# Patient Record
Sex: Male | Born: 1935
Health system: Southern US, Community
[De-identification: ages and names within clinical notes are randomized; demographics above are authoritative.]

## PROBLEM LIST (undated history)

## (undated) DIAGNOSIS — K219 Gastro-esophageal reflux disease without esophagitis: Secondary | ICD-10-CM

## (undated) DIAGNOSIS — K5790 Diverticulosis of intestine, part unspecified, without perforation or abscess without bleeding: Secondary | ICD-10-CM

## (undated) DIAGNOSIS — M1990 Unspecified osteoarthritis, unspecified site: Secondary | ICD-10-CM

## (undated) DIAGNOSIS — E039 Hypothyroidism, unspecified: Secondary | ICD-10-CM

## (undated) DIAGNOSIS — J45909 Unspecified asthma, uncomplicated: Secondary | ICD-10-CM

## (undated) DIAGNOSIS — N400 Enlarged prostate without lower urinary tract symptoms: Secondary | ICD-10-CM

## (undated) DIAGNOSIS — M26609 Unspecified temporomandibular joint disorder, unspecified side: Secondary | ICD-10-CM

## (undated) DIAGNOSIS — K5120 Ulcerative (chronic) proctitis without complications: Secondary | ICD-10-CM

## (undated) DIAGNOSIS — N419 Inflammatory disease of prostate, unspecified: Secondary | ICD-10-CM

## (undated) DIAGNOSIS — K529 Noninfective gastroenteritis and colitis, unspecified: Secondary | ICD-10-CM

## (undated) DIAGNOSIS — Z860100 Personal history of colon polyps, unspecified: Secondary | ICD-10-CM

## (undated) DIAGNOSIS — C4490 Unspecified malignant neoplasm of skin, unspecified: Secondary | ICD-10-CM

## (undated) DIAGNOSIS — K2270 Barrett's esophagus without dysplasia: Secondary | ICD-10-CM

## (undated) DIAGNOSIS — J42 Unspecified chronic bronchitis: Secondary | ICD-10-CM

## (undated) DIAGNOSIS — H409 Unspecified glaucoma: Secondary | ICD-10-CM

## (undated) HISTORY — DX: Unspecified malignant neoplasm of skin, unspecified: C44.90

## (undated) HISTORY — PX: PROSTATE SURGERY: SHX751

## (undated) HISTORY — PX: HERNIA REPAIR: SHX51

## (undated) HISTORY — DX: Benign prostatic hyperplasia without lower urinary tract symptoms: N40.0

## (undated) HISTORY — DX: Unspecified temporomandibular joint disorder, unspecified side: M26.609

## (undated) HISTORY — PX: EXTERNAL EAR SURGERY: SHX627

## (undated) HISTORY — PX: SEPTOPLASTY: SUR1290

## (undated) HISTORY — PX: HAND SURGERY: SHX662

## (undated) HISTORY — DX: Noninfective gastroenteritis and colitis, unspecified: K52.9

## (undated) HISTORY — PX: CYSTECTOMY: SUR359

---

## 1999-10-14 ENCOUNTER — Other Ambulatory Visit: Admission: RE | Admit: 1999-10-14 | Discharge: 1999-10-14 | Payer: Self-pay | Admitting: Urology

## 2005-02-13 ENCOUNTER — Emergency Department (HOSPITAL_COMMUNITY): Admission: EM | Admit: 2005-02-13 | Discharge: 2005-02-13 | Payer: Self-pay | Admitting: Emergency Medicine

## 2005-04-06 ENCOUNTER — Emergency Department (HOSPITAL_COMMUNITY): Admission: EM | Admit: 2005-04-06 | Discharge: 2005-04-06 | Payer: Self-pay | Admitting: Emergency Medicine

## 2005-06-24 ENCOUNTER — Encounter: Admission: RE | Admit: 2005-06-24 | Discharge: 2005-06-24 | Payer: Self-pay | Admitting: Urology

## 2005-06-28 ENCOUNTER — Ambulatory Visit (HOSPITAL_BASED_OUTPATIENT_CLINIC_OR_DEPARTMENT_OTHER): Admission: RE | Admit: 2005-06-28 | Discharge: 2005-06-28 | Payer: Self-pay | Admitting: Urology

## 2006-03-16 ENCOUNTER — Ambulatory Visit: Payer: Self-pay | Admitting: Unknown Physician Specialty

## 2010-01-01 ENCOUNTER — Ambulatory Visit: Payer: Self-pay | Admitting: Unknown Physician Specialty

## 2010-01-02 LAB — PATHOLOGY REPORT

## 2011-06-29 DIAGNOSIS — R339 Retention of urine, unspecified: Secondary | ICD-10-CM | POA: Diagnosis not present

## 2011-06-29 DIAGNOSIS — N401 Enlarged prostate with lower urinary tract symptoms: Secondary | ICD-10-CM | POA: Diagnosis not present

## 2011-06-29 DIAGNOSIS — R972 Elevated prostate specific antigen [PSA]: Secondary | ICD-10-CM | POA: Diagnosis not present

## 2011-07-06 DIAGNOSIS — H40039 Anatomical narrow angle, unspecified eye: Secondary | ICD-10-CM | POA: Diagnosis not present

## 2011-07-13 DIAGNOSIS — R5383 Other fatigue: Secondary | ICD-10-CM | POA: Diagnosis not present

## 2011-07-13 DIAGNOSIS — L259 Unspecified contact dermatitis, unspecified cause: Secondary | ICD-10-CM | POA: Diagnosis not present

## 2011-07-13 DIAGNOSIS — Z13220 Encounter for screening for lipoid disorders: Secondary | ICD-10-CM | POA: Diagnosis not present

## 2011-07-13 DIAGNOSIS — R5381 Other malaise: Secondary | ICD-10-CM | POA: Diagnosis not present

## 2011-07-13 DIAGNOSIS — Z0000 Encounter for general adult medical examination without abnormal findings: Secondary | ICD-10-CM | POA: Diagnosis not present

## 2011-08-05 DIAGNOSIS — E039 Hypothyroidism, unspecified: Secondary | ICD-10-CM | POA: Diagnosis not present

## 2011-08-05 DIAGNOSIS — Z0000 Encounter for general adult medical examination without abnormal findings: Secondary | ICD-10-CM | POA: Diagnosis not present

## 2011-08-05 DIAGNOSIS — L259 Unspecified contact dermatitis, unspecified cause: Secondary | ICD-10-CM | POA: Diagnosis not present

## 2011-08-09 DIAGNOSIS — K5120 Ulcerative (chronic) proctitis without complications: Secondary | ICD-10-CM | POA: Diagnosis not present

## 2011-08-09 DIAGNOSIS — I10 Essential (primary) hypertension: Secondary | ICD-10-CM | POA: Diagnosis not present

## 2011-11-23 DIAGNOSIS — Z0000 Encounter for general adult medical examination without abnormal findings: Secondary | ICD-10-CM | POA: Diagnosis not present

## 2011-11-23 DIAGNOSIS — L259 Unspecified contact dermatitis, unspecified cause: Secondary | ICD-10-CM | POA: Diagnosis not present

## 2011-11-23 DIAGNOSIS — E039 Hypothyroidism, unspecified: Secondary | ICD-10-CM | POA: Diagnosis not present

## 2011-12-13 DIAGNOSIS — Z85828 Personal history of other malignant neoplasm of skin: Secondary | ICD-10-CM | POA: Diagnosis not present

## 2011-12-13 DIAGNOSIS — C44611 Basal cell carcinoma of skin of unspecified upper limb, including shoulder: Secondary | ICD-10-CM | POA: Diagnosis not present

## 2011-12-13 DIAGNOSIS — D485 Neoplasm of uncertain behavior of skin: Secondary | ICD-10-CM | POA: Diagnosis not present

## 2011-12-13 DIAGNOSIS — L570 Actinic keratosis: Secondary | ICD-10-CM | POA: Diagnosis not present

## 2012-01-04 DIAGNOSIS — H4010X0 Unspecified open-angle glaucoma, stage unspecified: Secondary | ICD-10-CM | POA: Diagnosis not present

## 2012-01-11 DIAGNOSIS — C44621 Squamous cell carcinoma of skin of unspecified upper limb, including shoulder: Secondary | ICD-10-CM | POA: Diagnosis not present

## 2012-01-17 DIAGNOSIS — A499 Bacterial infection, unspecified: Secondary | ICD-10-CM | POA: Diagnosis not present

## 2012-02-01 DIAGNOSIS — Z23 Encounter for immunization: Secondary | ICD-10-CM | POA: Diagnosis not present

## 2012-02-11 DIAGNOSIS — Z0000 Encounter for general adult medical examination without abnormal findings: Secondary | ICD-10-CM | POA: Diagnosis not present

## 2012-02-11 DIAGNOSIS — L259 Unspecified contact dermatitis, unspecified cause: Secondary | ICD-10-CM | POA: Diagnosis not present

## 2012-02-11 DIAGNOSIS — M7710 Lateral epicondylitis, unspecified elbow: Secondary | ICD-10-CM | POA: Diagnosis not present

## 2012-04-12 DIAGNOSIS — Z85828 Personal history of other malignant neoplasm of skin: Secondary | ICD-10-CM | POA: Diagnosis not present

## 2012-04-12 DIAGNOSIS — L570 Actinic keratosis: Secondary | ICD-10-CM | POA: Diagnosis not present

## 2012-04-21 DIAGNOSIS — E039 Hypothyroidism, unspecified: Secondary | ICD-10-CM | POA: Diagnosis not present

## 2012-04-21 DIAGNOSIS — J069 Acute upper respiratory infection, unspecified: Secondary | ICD-10-CM | POA: Diagnosis not present

## 2012-04-21 DIAGNOSIS — Z0000 Encounter for general adult medical examination without abnormal findings: Secondary | ICD-10-CM | POA: Diagnosis not present

## 2012-07-04 DIAGNOSIS — H4010X0 Unspecified open-angle glaucoma, stage unspecified: Secondary | ICD-10-CM | POA: Diagnosis not present

## 2012-08-14 DIAGNOSIS — E039 Hypothyroidism, unspecified: Secondary | ICD-10-CM | POA: Diagnosis not present

## 2012-08-14 DIAGNOSIS — Z0000 Encounter for general adult medical examination without abnormal findings: Secondary | ICD-10-CM | POA: Diagnosis not present

## 2012-08-14 DIAGNOSIS — Z125 Encounter for screening for malignant neoplasm of prostate: Secondary | ICD-10-CM | POA: Diagnosis not present

## 2012-08-14 DIAGNOSIS — M7710 Lateral epicondylitis, unspecified elbow: Secondary | ICD-10-CM | POA: Diagnosis not present

## 2012-08-14 DIAGNOSIS — Z1331 Encounter for screening for depression: Secondary | ICD-10-CM | POA: Diagnosis not present

## 2012-08-14 DIAGNOSIS — Z1339 Encounter for screening examination for other mental health and behavioral disorders: Secondary | ICD-10-CM | POA: Diagnosis not present

## 2012-11-29 DIAGNOSIS — Z125 Encounter for screening for malignant neoplasm of prostate: Secondary | ICD-10-CM | POA: Diagnosis not present

## 2012-11-29 DIAGNOSIS — Z1339 Encounter for screening examination for other mental health and behavioral disorders: Secondary | ICD-10-CM | POA: Diagnosis not present

## 2012-11-29 DIAGNOSIS — L988 Other specified disorders of the skin and subcutaneous tissue: Secondary | ICD-10-CM | POA: Diagnosis not present

## 2012-11-29 DIAGNOSIS — M7710 Lateral epicondylitis, unspecified elbow: Secondary | ICD-10-CM | POA: Diagnosis not present

## 2012-12-11 DIAGNOSIS — L570 Actinic keratosis: Secondary | ICD-10-CM | POA: Diagnosis not present

## 2012-12-11 DIAGNOSIS — Z85828 Personal history of other malignant neoplasm of skin: Secondary | ICD-10-CM | POA: Diagnosis not present

## 2013-01-02 DIAGNOSIS — H40009 Preglaucoma, unspecified, unspecified eye: Secondary | ICD-10-CM | POA: Diagnosis not present

## 2013-01-30 DIAGNOSIS — Z23 Encounter for immunization: Secondary | ICD-10-CM | POA: Diagnosis not present

## 2013-03-15 DIAGNOSIS — K5120 Ulcerative (chronic) proctitis without complications: Secondary | ICD-10-CM | POA: Diagnosis not present

## 2013-06-26 ENCOUNTER — Encounter: Payer: Self-pay | Admitting: Cardiovascular Disease

## 2013-06-26 ENCOUNTER — Ambulatory Visit (INDEPENDENT_AMBULATORY_CARE_PROVIDER_SITE_OTHER): Payer: Medicare Other | Admitting: Cardiovascular Disease

## 2013-06-26 ENCOUNTER — Encounter (INDEPENDENT_AMBULATORY_CARE_PROVIDER_SITE_OTHER): Payer: Self-pay

## 2013-06-26 VITALS — BP 130/60 | HR 69 | Ht 71.5 in | Wt 170.2 lb

## 2013-06-26 DIAGNOSIS — K5289 Other specified noninfective gastroenteritis and colitis: Secondary | ICD-10-CM

## 2013-06-26 DIAGNOSIS — R0789 Other chest pain: Secondary | ICD-10-CM | POA: Insufficient documentation

## 2013-06-26 DIAGNOSIS — K529 Noninfective gastroenteritis and colitis, unspecified: Secondary | ICD-10-CM

## 2013-06-26 HISTORY — DX: Noninfective gastroenteritis and colitis, unspecified: K52.9

## 2013-06-26 NOTE — Progress Notes (Signed)
   Patient ID: Jesse Sandoval, male    DOB: November 21, 1931, 80 y.o.   MRN: [id number]  HPI Comments: Mr Cueva is a pleasant 80 yo male patient of Dr. Caryn Section, who presents with episodes of chest tightness.  He reports that over the past several months he has had left pectoral pain that comes and goes sometimes at rest, sometimes with exertion. He walks daily up to 2 miles at a time and denies having any symptoms when walking. Symptoms can come while he is on the couch, sometimes when active. When he has symptoms, he will rub his pectoral region and typically symptoms will resolve. Pain does not feel deep, more superficial. In general is not associated with exertion .  He is very active [place] his [place] fixing machinery, doing a lot of gardening, lifting heavy items   No prior smoking history, no diabetes  Never been told that his cholesterol is high  Lab work from Dr. Caryn Section 2013 shows total cholesterol 167, LDL 88, HDL 65  EKG today shows normal sinus rhythm with rate 69 beats a minute with no significant ST or T wave changes    Outpatient Encounter Prescriptions as of 06/26/2013  Medication Sig  . brimonidine (ALPHAGAN) 0.2 % ophthalmic solution Place 2 drops into both eyes 2 (two) times daily.  Marland Kitchen levothyroxine (SYNTHROID, LEVOTHROID) 50 MCG tablet Take 50 mcg by mouth daily before breakfast.  . mesalamine (CANASA) 1000 MG suppository Place 1,000 mg rectally as needed.   . [DISCONTINUED] triamcinolone cream (KENALOG) 0.5 % Apply 1 application topically 2 (two) times daily.    Review of Systems  Constitutional: Negative.   HENT: Negative.   Eyes: Negative.   Respiratory: Negative.   Cardiovascular: Positive for chest pain.  Gastrointestinal: Negative.   Endocrine: Negative.   Musculoskeletal: Negative.   Skin: Negative.   Allergic/Immunologic: Negative.   Neurological: Negative.   Hematological: Negative.   Psychiatric/Behavioral: Negative.   All other systems reviewed and are  negative.    BP 130/60  Pulse 69  Ht 5' 11.5" (1.816 m)  Wt 170 lb 4 oz (77.225 kg)  BMI 23.42 kg/m2  Physical Exam  Nursing note and vitals reviewed. Constitutional: He is oriented to person, place, and time. He appears well-developed and well-nourished.  HENT:  Head: Normocephalic.  Nose: Nose normal.  Mouth/Throat: Oropharynx is clear and moist.  Eyes: Conjunctivae are normal. Pupils are equal, round, and reactive to light.  Neck: Normal range of motion. Neck supple. No JVD present.  Cardiovascular: Normal rate, regular rhythm, S1 normal, S2 normal, normal heart sounds and intact distal pulses.  Exam reveals no gallop and no friction rub.   No murmur heard. Pulmonary/Chest: Effort normal and breath sounds normal. No respiratory distress. He has no wheezes. He has no rales. He exhibits no tenderness.  Abdominal: Soft. Bowel sounds are normal. He exhibits no distension. There is no tenderness.  Musculoskeletal: Normal range of motion. He exhibits no edema and no tenderness.  Lymphadenopathy:    He has no cervical adenopathy.  Neurological: He is alert and oriented to person, place, and time. Coordination normal.  Skin: Skin is warm and dry. No rash noted. No erythema.  Psychiatric: He has a normal mood and affect. His behavior is normal. Judgment and thought content normal.      Assessment and Plan

## 2013-06-26 NOTE — Assessment & Plan Note (Signed)
He reports periodic issues with his colitis. Not an active issue

## 2013-06-26 NOTE — Assessment & Plan Note (Addendum)
Etiology of his chest tightness is likely noncardiac. He is low risk of CAD given good cholesterol, no smoking history, no diabetes . There is a clear musculoskeletal component as symptoms are resolved with massage, coming on with rest. We have recommended he contact our office if symptoms present with exertion on his daily walks. Currently he has no significant symptoms when walking up a hill. No stress test ordered at this time given the atypical nature of his symptoms. We have recommended massage, heat, NSAIDs as needed for his left pectoral pain

## 2013-06-26 NOTE — Patient Instructions (Signed)
You are doing well. No medication changes were made.  Please call the office if you have worsening shortness of breath or chest pain  Please call us if you have new issues that need to be addressed before your next appt.

## 2013-07-03 DIAGNOSIS — H4010X0 Unspecified open-angle glaucoma, stage unspecified: Secondary | ICD-10-CM | POA: Diagnosis not present

## 2013-08-15 ENCOUNTER — Ambulatory Visit: Payer: Self-pay | Admitting: Family Medicine

## 2013-08-15 DIAGNOSIS — M549 Dorsalgia, unspecified: Secondary | ICD-10-CM | POA: Diagnosis not present

## 2013-08-15 DIAGNOSIS — M47817 Spondylosis without myelopathy or radiculopathy, lumbosacral region: Secondary | ICD-10-CM | POA: Diagnosis not present

## 2013-08-15 DIAGNOSIS — M5137 Other intervertebral disc degeneration, lumbosacral region: Secondary | ICD-10-CM | POA: Diagnosis not present

## 2013-08-15 DIAGNOSIS — Z23 Encounter for immunization: Secondary | ICD-10-CM | POA: Diagnosis not present

## 2013-08-15 DIAGNOSIS — Z125 Encounter for screening for malignant neoplasm of prostate: Secondary | ICD-10-CM | POA: Diagnosis not present

## 2013-08-15 DIAGNOSIS — Z0000 Encounter for general adult medical examination without abnormal findings: Secondary | ICD-10-CM | POA: Diagnosis not present

## 2013-08-15 DIAGNOSIS — E039 Hypothyroidism, unspecified: Secondary | ICD-10-CM | POA: Diagnosis not present

## 2013-08-15 DIAGNOSIS — Z131 Encounter for screening for diabetes mellitus: Secondary | ICD-10-CM | POA: Diagnosis not present

## 2013-08-15 DIAGNOSIS — J309 Allergic rhinitis, unspecified: Secondary | ICD-10-CM | POA: Diagnosis not present

## 2013-12-10 DIAGNOSIS — Z85828 Personal history of other malignant neoplasm of skin: Secondary | ICD-10-CM | POA: Diagnosis not present

## 2013-12-10 DIAGNOSIS — L570 Actinic keratosis: Secondary | ICD-10-CM | POA: Diagnosis not present

## 2014-01-01 DIAGNOSIS — H16223 Keratoconjunctivitis sicca, not specified as Sjogren's, bilateral: Secondary | ICD-10-CM | POA: Diagnosis not present

## 2014-01-01 DIAGNOSIS — H40053 Ocular hypertension, bilateral: Secondary | ICD-10-CM | POA: Diagnosis not present

## 2014-01-10 DIAGNOSIS — H40053 Ocular hypertension, bilateral: Secondary | ICD-10-CM | POA: Diagnosis not present

## 2014-01-22 DIAGNOSIS — Z23 Encounter for immunization: Secondary | ICD-10-CM | POA: Diagnosis not present

## 2014-04-10 DIAGNOSIS — H40053 Ocular hypertension, bilateral: Secondary | ICD-10-CM | POA: Diagnosis not present

## 2014-04-30 DIAGNOSIS — Z86010 Personal history of colonic polyps: Secondary | ICD-10-CM | POA: Diagnosis not present

## 2014-04-30 DIAGNOSIS — K5120 Ulcerative (chronic) proctitis without complications: Secondary | ICD-10-CM | POA: Diagnosis not present

## 2014-07-11 DIAGNOSIS — H4010X0 Unspecified open-angle glaucoma, stage unspecified: Secondary | ICD-10-CM | POA: Diagnosis not present

## 2014-07-18 DIAGNOSIS — H40033 Anatomical narrow angle, bilateral: Secondary | ICD-10-CM | POA: Diagnosis not present

## 2014-07-18 DIAGNOSIS — H40053 Ocular hypertension, bilateral: Secondary | ICD-10-CM | POA: Diagnosis not present

## 2014-08-23 DIAGNOSIS — R109 Unspecified abdominal pain: Secondary | ICD-10-CM | POA: Diagnosis not present

## 2014-08-23 DIAGNOSIS — E039 Hypothyroidism, unspecified: Secondary | ICD-10-CM | POA: Diagnosis not present

## 2014-08-23 DIAGNOSIS — Z0000 Encounter for general adult medical examination without abnormal findings: Secondary | ICD-10-CM | POA: Diagnosis not present

## 2014-08-23 DIAGNOSIS — M545 Low back pain: Secondary | ICD-10-CM | POA: Diagnosis not present

## 2014-08-23 LAB — BASIC METABOLIC PANEL
BUN: 32 mg/dL — AB (ref 4–21)
Creatinine: 1.3 mg/dL (ref 0.6–1.3)
GLUCOSE: 98 mg/dL
POTASSIUM: 4.7 mmol/L (ref 3.4–5.3)
SODIUM: 143 mmol/L (ref 137–147)

## 2014-08-23 LAB — CBC AND DIFFERENTIAL
HCT: 40 % — AB (ref 41–53)
Hemoglobin: 13.5 g/dL (ref 13.5–17.5)
NEUTROS ABS: 4 /uL
Platelets: 256 10*3/uL (ref 150–399)
WBC: 7.7 10^3/mL

## 2014-08-23 LAB — HEPATIC FUNCTION PANEL
ALT: 14 U/L (ref 10–40)
AST: 19 U/L (ref 14–40)
Alkaline Phosphatase: 101 U/L (ref 25–125)
BILIRUBIN, TOTAL: 0.3 mg/dL

## 2014-08-23 LAB — TSH: TSH: 3.8 u[IU]/mL (ref 0.41–5.90)

## 2014-08-30 ENCOUNTER — Telehealth: Payer: Self-pay

## 2014-08-30 NOTE — Telephone Encounter (Signed)
-----   Message from Birdie Sons, MD sent at 08/28/2014  9:14 PM EDT ----- Regarding: Lab results Please advise patient that labs are all normal except mild kidney impairment. His abdominal pain is probable from stomach irritation. Recommend he try OTC ranitidine 150mg  twice a day. If pain is not gone within 2 weeks, then he needs to call back for GI referral.

## 2014-09-02 ENCOUNTER — Encounter: Payer: Self-pay | Admitting: Family Medicine

## 2014-09-02 NOTE — Telephone Encounter (Signed)
Patient's wife Ronney Lion was notified of results.

## 2014-11-05 DIAGNOSIS — K5120 Ulcerative (chronic) proctitis without complications: Secondary | ICD-10-CM | POA: Diagnosis not present

## 2014-11-05 DIAGNOSIS — Z86010 Personal history of colonic polyps: Secondary | ICD-10-CM | POA: Diagnosis not present

## 2014-12-09 DIAGNOSIS — X32XXXA Exposure to sunlight, initial encounter: Secondary | ICD-10-CM | POA: Diagnosis not present

## 2014-12-09 DIAGNOSIS — Z85828 Personal history of other malignant neoplasm of skin: Secondary | ICD-10-CM | POA: Diagnosis not present

## 2014-12-09 DIAGNOSIS — L570 Actinic keratosis: Secondary | ICD-10-CM | POA: Diagnosis not present

## 2014-12-09 DIAGNOSIS — L821 Other seborrheic keratosis: Secondary | ICD-10-CM | POA: Diagnosis not present

## 2015-01-02 ENCOUNTER — Encounter: Payer: Self-pay | Admitting: *Deleted

## 2015-01-03 ENCOUNTER — Encounter: Payer: Self-pay | Admitting: *Deleted

## 2015-01-03 ENCOUNTER — Encounter
Admission: RE | Disposition: A | Discharge status: Home / Self Care | Payer: Self-pay | Source: Ambulatory Visit | Attending: Unknown Physician Specialty

## 2015-01-03 ENCOUNTER — Ambulatory Visit: Payer: Medicare Other | Admitting: Anesthesiology

## 2015-01-03 ENCOUNTER — Ambulatory Visit
Admission: RE | Admit: 2015-01-03 | Discharge: 2015-01-03 | Disposition: A | Discharge status: Home / Self Care | Payer: Medicare Other | Source: Ambulatory Visit | Attending: Unknown Physician Specialty | Admitting: Unknown Physician Specialty

## 2015-01-03 DIAGNOSIS — H409 Unspecified glaucoma: Secondary | ICD-10-CM | POA: Diagnosis not present

## 2015-01-03 DIAGNOSIS — Z880 Allergy status to penicillin: Secondary | ICD-10-CM | POA: Insufficient documentation

## 2015-01-03 DIAGNOSIS — E079 Disorder of thyroid, unspecified: Secondary | ICD-10-CM | POA: Diagnosis not present

## 2015-01-03 DIAGNOSIS — D120 Benign neoplasm of cecum: Secondary | ICD-10-CM | POA: Diagnosis not present

## 2015-01-03 DIAGNOSIS — Z885 Allergy status to narcotic agent status: Secondary | ICD-10-CM | POA: Insufficient documentation

## 2015-01-03 DIAGNOSIS — Z882 Allergy status to sulfonamides status: Secondary | ICD-10-CM | POA: Insufficient documentation

## 2015-01-03 DIAGNOSIS — Z79899 Other long term (current) drug therapy: Secondary | ICD-10-CM | POA: Diagnosis not present

## 2015-01-03 DIAGNOSIS — K635 Polyp of colon: Secondary | ICD-10-CM | POA: Diagnosis not present

## 2015-01-03 DIAGNOSIS — E039 Hypothyroidism, unspecified: Secondary | ICD-10-CM | POA: Diagnosis not present

## 2015-01-03 DIAGNOSIS — K648 Other hemorrhoids: Secondary | ICD-10-CM | POA: Diagnosis not present

## 2015-01-03 DIAGNOSIS — K5120 Ulcerative (chronic) proctitis without complications: Secondary | ICD-10-CM | POA: Insufficient documentation

## 2015-01-03 DIAGNOSIS — D123 Benign neoplasm of transverse colon: Secondary | ICD-10-CM | POA: Insufficient documentation

## 2015-01-03 DIAGNOSIS — Z86010 Personal history of colonic polyps: Secondary | ICD-10-CM | POA: Diagnosis present

## 2015-01-03 DIAGNOSIS — D124 Benign neoplasm of descending colon: Secondary | ICD-10-CM | POA: Diagnosis not present

## 2015-01-03 DIAGNOSIS — K640 First degree hemorrhoids: Secondary | ICD-10-CM | POA: Insufficient documentation

## 2015-01-03 DIAGNOSIS — N400 Enlarged prostate without lower urinary tract symptoms: Secondary | ICD-10-CM | POA: Insufficient documentation

## 2015-01-03 DIAGNOSIS — D122 Benign neoplasm of ascending colon: Secondary | ICD-10-CM | POA: Insufficient documentation

## 2015-01-03 DIAGNOSIS — Z85828 Personal history of other malignant neoplasm of skin: Secondary | ICD-10-CM | POA: Diagnosis not present

## 2015-01-03 HISTORY — PX: COLONOSCOPY WITH PROPOFOL: SHX5780

## 2015-01-03 HISTORY — DX: Diverticulosis of intestine, part unspecified, without perforation or abscess without bleeding: K57.90

## 2015-01-03 HISTORY — DX: Personal history of colon polyps, unspecified: Z86.0100

## 2015-01-03 HISTORY — DX: Unspecified glaucoma: H40.9

## 2015-01-03 HISTORY — DX: Unspecified osteoarthritis, unspecified site: M19.90

## 2015-01-03 HISTORY — DX: Inflammatory disease of prostate, unspecified: N41.9

## 2015-01-03 HISTORY — DX: Ulcerative (chronic) proctitis without complications: K51.20

## 2015-01-03 HISTORY — DX: Personal history of colonic polyps: Z86.010

## 2015-01-03 HISTORY — DX: Barrett's esophagus without dysplasia: K22.70

## 2015-01-03 SURGERY — COLONOSCOPY WITH PROPOFOL
Anesthesia: General

## 2015-01-03 MED ORDER — SODIUM CHLORIDE 0.9 % IV SOLN: INTRAVENOUS | Status: DC

## 2015-01-03 MED ORDER — FENTANYL CITRATE (PF) 100 MCG/2ML IJ SOLN
INTRAMUSCULAR | Status: DC | PRN
  Administered 2015-01-03: 50 ug via INTRAVENOUS

## 2015-01-03 MED ORDER — PROPOFOL 500 MG/50ML IV EMUL
INTRAVENOUS | Status: DC | PRN
  Administered 2015-01-03: 120 ug/kg/min via INTRAVENOUS
  Administered 2015-01-03: 140 ug/kg/min via INTRAVENOUS

## 2015-01-03 MED ORDER — SODIUM CHLORIDE 0.9 % IV SOLN
INTRAVENOUS | Status: DC
  Administered 2015-01-03: 1000 mL via INTRAVENOUS

## 2015-01-03 MED ORDER — PROPOFOL 10 MG/ML IV BOLUS
INTRAVENOUS | Status: DC | PRN
  Administered 2015-01-03: 50 mg via INTRAVENOUS

## 2015-01-03 MED ORDER — MIDAZOLAM HCL 2 MG/2ML IJ SOLN
INTRAMUSCULAR | Status: DC | PRN
  Administered 2015-01-03: 1 mg via INTRAVENOUS

## 2015-01-03 MED ORDER — LIDOCAINE HCL (CARDIAC) 20 MG/ML IV SOLN
INTRAVENOUS | Status: DC | PRN
  Administered 2015-01-03: 30 mg via INTRAVENOUS

## 2015-01-03 NOTE — Anesthesia Procedure Notes (Signed)
Date/Time: 01/03/2015 1:55 PM Performed by: Doreen Salvage Pre-anesthesia Checklist: Patient identified, Emergency Drugs available, Suction available and Patient being monitored Patient Re-evaluated:Patient Re-evaluated prior to inductionOxygen Delivery Method: Nasal cannula Intubation Type: IV induction Dental Injury: Teeth and Oropharynx as per pre-operative assessment  Comments: Nasal cannula with etCO2 monitoring

## 2015-01-03 NOTE — Anesthesia Preprocedure Evaluation (Signed)
Anesthesia Evaluation  Patient identified by MRN, date of birth, ID band Patient awake    Reviewed: Allergy & Precautions, H&P , NPO status , Patient's Chart, lab work & pertinent test results, reviewed documented beta blocker date and time   Airway Mallampati: II  TM Distance: >3 FB Neck ROM: full    Dental no notable dental hx. (+) Edentulous Upper, Edentulous Lower   Pulmonary neg pulmonary ROS,    Pulmonary exam normal breath sounds clear to auscultation       Cardiovascular Exercise Tolerance: Good negative cardio ROS   Rhythm:regular Rate:Normal     Neuro/Psych negative neurological ROS  negative psych ROS   GI/Hepatic negative GI ROS, Neg liver ROS,   Endo/Other  negative endocrine ROSHypothyroidism   Renal/GU negative Renal ROS  negative genitourinary   Musculoskeletal   Abdominal   Peds  Hematology negative hematology ROS (+)   Anesthesia Other Findings   Reproductive/Obstetrics negative OB ROS                             Anesthesia Physical Anesthesia Plan  ASA: III  Anesthesia Plan: General   Post-op Pain Management:    Induction:   Airway Management Planned:   Additional Equipment:   Intra-op Plan:   Post-operative Plan:   Informed Consent: I have reviewed the patients History and Physical, chart, labs and discussed the procedure including the risks, benefits and alternatives for the proposed anesthesia with the patient or authorized representative who has indicated his/her understanding and acceptance.   Dental Advisory Given  Plan Discussed with: CRNA  Anesthesia Plan Comments:         Anesthesia Quick Evaluation

## 2015-01-03 NOTE — Transfer of Care (Signed)
Immediate Anesthesia Transfer of Care Note  Patient: Jesse Sandoval  Procedure(s) Performed: Procedure(s): COLONOSCOPY WITH PROPOFOL (N/A)  Patient Location: PACU and Endoscopy Unit  Anesthesia Type:General  Level of Consciousness: sedated  Airway & Oxygen Therapy: Patient Spontanous Breathing and Patient connected to nasal cannula oxygen  Post-op Assessment: Report given to RN and Post -op Vital signs reviewed and stable  Post vital signs: Reviewed and stable  Last Vitals:  Filed Vitals:   01/03/15 1424  BP: 103/57  Pulse:   Temp: 36.2 C  Resp: 16    Complications: No apparent anesthesia complications

## 2015-01-03 NOTE — H&P (Signed)
   Primary Care Physician:  Lelon Huh, MD Primary Gastroenterologist:  Dr. Vira Agar  Pre-Procedure History & Physical: HPI:  Jesse Sandoval is a 81 y.o. male is here for an colonoscopy.   Past Medical History  Diagnosis Date  . Thyroid disease   . Skin cancer   . Prostate enlargement   . Colitis   . Ulcerative proctitis ([place])   . Diverticulosis   . Prostatitis   . Barrett's esophagus   . Hypothyroidism   . Glaucoma   . Arthritis   . History of colon polyps     Past Surgical History  Procedure Laterality Date  . Hernia repair      x3  . Prostate surgery    . Septoplasty    . Hernia repair  [phone]    Prior to Admission medications   Medication Sig Start Date End Date Taking? Authorizing Provider  brimonidine (ALPHAGAN) 0.2 % ophthalmic solution Place 2 drops into both eyes 2 (two) times daily.   Yes Historical Provider, MD  levothyroxine (SYNTHROID, LEVOTHROID) 50 MCG tablet Take 50 mcg by mouth daily before breakfast.   Yes Historical Provider, MD  mesalamine (CANASA) 1000 MG suppository Place 1,000 mg rectally as needed.    Yes Historical Provider, MD  timolol (TIMOPTIC) 0.5 % ophthalmic solution Place 1 drop into both eyes 2 (two) times daily.   Yes Historical Provider, MD    Allergies as of 11/08/2014 - Review Complete 06/26/2013  Allergen Reaction Noted  . Codeine  06/25/2013  . Penicillins  06/25/2013  . Sulfa antibiotics  06/25/2013    Family History  Problem Relation Age of Onset  . Heart disease Mother   . Heart failure Mother   . Cancer Father     Social History   Social History  . Marital Status: Married    Spouse Name: N/A  . Number of Children: N/A  . Years of Education: N/A   Occupational History  . Not on file.   Social History Main Topics  . Smoking status: Never Smoker   . Smokeless tobacco: Never Used  . Alcohol Use: No  . Drug Use: No  . Sexual Activity: Not on file   Other Topics Concern  . Not on file   Social  History Narrative    Review of Systems: See HPI, otherwise negative ROS  Physical Exam: BP 177/72 mmHg  Pulse 55  Temp(Src) 98.2 F (36.8 C) (Tympanic)  Resp 16  Ht 5\' 11"  (1.803 m)  Wt 73.936 kg (163 lb)  BMI 22.74 kg/m2  SpO2 99% General:   Alert,  pleasant and cooperative in NAD Head:  Normocephalic and atraumatic. Neck:  Supple; no masses or thyromegaly. Lungs:  Clear throughout to auscultation.    Heart:  Regular rate and rhythm. Abdomen:  Soft, nontender and nondistended. Normal bowel sounds, without guarding, and without rebound.   Neurologic:  Alert and  oriented x4;  grossly normal neurologically.  Impression/Plan: Jesse Sandoval is here for an colonoscopy to be performed for [facility] colon polyps and chronic ulcerative proctitis  Risks, benefits, limitations, and alternatives regarding  colonoscopy have been reviewed with the patient.  Questions have been answered.  All parties agreeable.   Gaylyn Cheers, MD  01/03/2015, 1:49 PM

## 2015-01-03 NOTE — Op Note (Signed)
[facility] Gastroenterology Patient Name: Jesse Sandoval Procedure Date: 01/03/2015 1:56 PM MRN: [id number] Account #: [id number] Date of Birth: [birth year] Admit Type: Outpatient Age: 81 Room: [facility] ENDO ROOM 1 Gender: Male Note Status: Finalized Procedure:         Colonoscopy Indications:       Personal history of colonic polyps Providers:         Manya Silvas, MD Referring MD:      Kirstie Peri. Caryn Section, MD (Referring MD) Medicines:         Propofol per Anesthesia Complications:     No immediate complications. Procedure:         Pre-Anesthesia Assessment:                    - After reviewing the risks and benefits, the patient was                     deemed in satisfactory condition to undergo the procedure.                    After obtaining informed consent, the colonoscope was                     passed under direct vision. Throughout the procedure, the                     patient's blood pressure, pulse, and oxygen saturations                     were monitored continuously. The Colonoscope was                     introduced through the anus and advanced to the the cecum,                     identified by appendiceal orifice and ileocecal valve. The                     colonoscopy was performed without difficulty. The patient                     tolerated the procedure well. The quality of the bowel                     preparation was excellent. Findings:      Two sessile polyps were found in the descending colon and in the cecum.       The polyps were small in size. These polyps were removed with a cold       snare. Resection and retrieval were complete.      Three sessile polyps were found in the transverse colon, in the       ascending colon and in the cecum. The polyps were diminutive in size.       These polyps were removed with a jumbo cold forceps. Resection and       retrieval were complete.      Internal hemorrhoids were found. The hemorrhoids were small  and Grade I       (internal hemorrhoids that do not prolapse).      The rectum looked good only slight area of erythema. Impression:        - Two small polyps in the descending colon and in the  cecum. Resected and retrieved.                    - Three diminutive polyps in the transverse colon, in the                     ascending colon and in the cecum. Resected and retrieved.                    - Internal hemorrhoids. Recommendation:    - Await pathology results. Manya Silvas, MD 01/03/2015 2:28:30 PM This report has been signed electronically. Number of Addenda: 0 Note Initiated On: 01/03/2015 1:56 PM Scope Withdrawal Time: 0 hours 13 minutes 41 seconds  Total Procedure Duration: 0 hours 21 minutes 35 seconds       [facility]

## 2015-01-06 ENCOUNTER — Encounter: Payer: Self-pay | Admitting: Unknown Physician Specialty

## 2015-01-07 LAB — SURGICAL PATHOLOGY

## 2015-01-08 NOTE — Anesthesia Postprocedure Evaluation (Signed)
  Anesthesia Post-op Note  Patient: Jesse Sandoval  Procedure(s) Performed: Procedure(s): COLONOSCOPY WITH PROPOFOL (N/A)  Anesthesia type:General  Patient location: PACU  Post pain: Pain level controlled  Post assessment: Post-op Vital signs reviewed, Patient's Cardiovascular Status Stable, Respiratory Function Stable, Patent Airway and No signs of Nausea or vomiting  Post vital signs: Reviewed and stable  Last Vitals:  Filed Vitals:   01/03/15 1454  BP: 142/91  Pulse: 56  Temp:   Resp: 15    Level of consciousness: awake, alert  and patient cooperative  Complications: No apparent anesthesia complications

## 2015-01-14 ENCOUNTER — Encounter: Payer: Self-pay | Admitting: Family Medicine

## 2015-01-14 DIAGNOSIS — Z86010 Personal history of colonic polyps: Secondary | ICD-10-CM | POA: Insufficient documentation

## 2015-01-14 DIAGNOSIS — Z860101 Personal history of adenomatous and serrated colon polyps: Secondary | ICD-10-CM | POA: Insufficient documentation

## 2015-01-15 DIAGNOSIS — H40053 Ocular hypertension, bilateral: Secondary | ICD-10-CM | POA: Diagnosis not present

## 2015-01-22 ENCOUNTER — Other Ambulatory Visit: Payer: Self-pay | Admitting: Family Medicine

## 2015-02-03 DIAGNOSIS — Z23 Encounter for immunization: Secondary | ICD-10-CM | POA: Diagnosis not present

## 2015-07-14 DIAGNOSIS — H40053 Ocular hypertension, bilateral: Secondary | ICD-10-CM | POA: Diagnosis not present

## 2015-07-22 DIAGNOSIS — H2513 Age-related nuclear cataract, bilateral: Secondary | ICD-10-CM | POA: Diagnosis not present

## 2015-08-15 DIAGNOSIS — E039 Hypothyroidism, unspecified: Secondary | ICD-10-CM | POA: Insufficient documentation

## 2015-08-15 DIAGNOSIS — J309 Allergic rhinitis, unspecified: Secondary | ICD-10-CM | POA: Insufficient documentation

## 2015-08-15 DIAGNOSIS — K5730 Diverticulosis of large intestine without perforation or abscess without bleeding: Secondary | ICD-10-CM | POA: Insufficient documentation

## 2015-08-15 DIAGNOSIS — H409 Unspecified glaucoma: Secondary | ICD-10-CM | POA: Insufficient documentation

## 2015-08-15 DIAGNOSIS — M26609 Unspecified temporomandibular joint disorder, unspecified side: Secondary | ICD-10-CM | POA: Insufficient documentation

## 2015-08-15 DIAGNOSIS — C4491 Basal cell carcinoma of skin, unspecified: Secondary | ICD-10-CM | POA: Insufficient documentation

## 2015-08-15 DIAGNOSIS — N400 Enlarged prostate without lower urinary tract symptoms: Secondary | ICD-10-CM | POA: Insufficient documentation

## 2015-08-15 DIAGNOSIS — L309 Dermatitis, unspecified: Secondary | ICD-10-CM | POA: Insufficient documentation

## 2015-08-15 HISTORY — DX: Unspecified temporomandibular joint disorder, unspecified side: M26.609

## 2015-09-02 ENCOUNTER — Encounter: Payer: Self-pay | Admitting: Family Medicine

## 2015-09-03 ENCOUNTER — Encounter: Payer: Self-pay | Admitting: Family Medicine

## 2015-09-03 ENCOUNTER — Ambulatory Visit (INDEPENDENT_AMBULATORY_CARE_PROVIDER_SITE_OTHER): Payer: Medicare Other | Admitting: Family Medicine

## 2015-09-03 VITALS — BP 126/64 | HR 57 | Temp 98.1°F | Resp 16 | Ht 70.5 in | Wt 163.0 lb

## 2015-09-03 DIAGNOSIS — E039 Hypothyroidism, unspecified: Secondary | ICD-10-CM | POA: Diagnosis not present

## 2015-09-03 DIAGNOSIS — Z0000 Encounter for general adult medical examination without abnormal findings: Secondary | ICD-10-CM

## 2015-09-03 DIAGNOSIS — N183 Chronic kidney disease, stage 3 unspecified: Secondary | ICD-10-CM

## 2015-09-03 DIAGNOSIS — J309 Allergic rhinitis, unspecified: Secondary | ICD-10-CM | POA: Diagnosis not present

## 2015-09-03 DIAGNOSIS — N189 Chronic kidney disease, unspecified: Secondary | ICD-10-CM | POA: Insufficient documentation

## 2015-09-03 MED ORDER — MONTELUKAST SODIUM 10 MG PO TABS: 10.0000 mg | ORAL_TABLET | Freq: Every day | ORAL | Status: DC

## 2015-09-03 NOTE — Progress Notes (Signed)
Patient: Jesse Sandoval, Male    DOB: 1931-12-31, 82 y.o.   MRN: [id number] Visit Date: 09/03/2015  Today's Provider: Lelon Huh, MD   Chief Complaint  Patient presents with  . Medicare Wellness  . Hypothyroidism    follow up  . Back Pain    follow up   Subjective:    Annual wellness visit Jesse Sandoval is a 82 y.o. male. He feels fairly well. He reports exercising daily. He reports he is sleeping fairly well.  ----------------------------------------------------------- Follow up Hypothyroidism:  Last office visit was 1 year ago and no changes were made. Patient reports good compliance with treatment and good tolerance.  Follow up Back pain:  Last office visit was 1 year ago. Changes made during that visit includes starting Tramadol 50mg  twice daily as needed. If Tramadol didn't help, patient was to call back for a referral to Physical therapy. Since last visit, patient states he never started the Tramadol. Patient states he does not like taking medication due to possible side effects they may have.   States he gets a little wheezing when he breathes out, worse when lying down. Takes Singulair occasionally when allergies flare up, which works well.   Review of Systems  Constitutional: Positive for fatigue. Negative for fever, chills and appetite change.  HENT: Positive for congestion, rhinorrhea, sinus pressure and sneezing. Negative for ear pain, hearing loss, nosebleeds and trouble swallowing.   Eyes: Negative for pain and visual disturbance.  Respiratory: Positive for cough, chest tightness and wheezing. Negative for shortness of breath.   Cardiovascular: Negative for chest pain, palpitations and leg swelling.  Gastrointestinal: Negative for nausea, vomiting, abdominal pain, diarrhea, constipation and blood in stool.  Endocrine: Negative for polydipsia, polyphagia and polyuria.  Genitourinary: Positive for enuresis. Negative for dysuria and flank pain.  Musculoskeletal:  Positive for back pain and arthralgias. Negative for myalgias, joint swelling and neck stiffness.  Skin: Negative for color change, rash and wound.  Allergic/Immunologic: Positive for environmental allergies.  Neurological: Negative for dizziness, tremors, seizures, speech difficulty, weakness, light-headedness and headaches.  Hematological: Bruises/bleeds easily.  Psychiatric/Behavioral: Negative for behavioral problems, confusion, sleep disturbance, dysphoric mood and decreased concentration. The patient is not nervous/anxious.   All other systems reviewed and are negative.   Social History   Social History  . Marital Status: Married    Spouse Name: N/A  . Number of Children: N/A  . Years of Education: N/A   Occupational History  . Not on file.   Social History Main Topics  . Smoking status: Never Smoker   . Smokeless tobacco: Never Used  . Alcohol Use: No  . Drug Use: No  . Sexual Activity: Not on file   Other Topics Concern  . Not on file   Social History Narrative    Past Medical History  Diagnosis Date  . Thyroid disease   . Skin cancer   . Prostate enlargement   . Colitis   . Ulcerative proctitis ([place])   . Diverticulosis   . Prostatitis   . Barrett's esophagus   . Hypothyroidism   . Glaucoma   . Arthritis   . History of colon polyps      Patient Active Problem List   Diagnosis Date Noted  . Allergic rhinitis 08/15/2015  . Basal cell carcinoma of skin 08/15/2015  . Benign enlargement of prostate 08/15/2015  . Diverticulosis of colon 08/15/2015  . Dermatitis, eczematoid 08/15/2015  . Glaucoma 08/15/2015  . Adult  hypothyroidism 08/15/2015  . Temporomandibular joint disorders 08/15/2015  . History of adenomatous polyp of colon 01/14/2015  . Chest tightness 06/26/2013  . Colitis 06/26/2013    Past Surgical History  Procedure Laterality Date  . Hernia repair      x3  . Prostate surgery    . Septoplasty    . Hernia repair  [phone]  .  Colonoscopy with propofol N/A 01/03/2015    Procedure: COLONOSCOPY WITH PROPOFOL;  Surgeon: Manya Silvas, MD;  Location: [facility] ENDOSCOPY;  Service: Endoscopy;  Laterality: N/A;  . Cystectomy      chest area on the right  . External ear surgery    . Hand surgery      His family history includes Cancer in his father; Heart disease in his mother; Heart failure in his mother; Hypertension in his mother.    Previous Medications   LEVOTHYROXINE (SYNTHROID, LEVOTHROID) 50 MCG TABLET    TAKE ONE (1) TABLET BY MOUTH EVERY DAY   MESALAMINE (CANASA) 1000 MG SUPPOSITORY    Place 1,000 mg rectally as needed.    TIMOLOL (TIMOPTIC) 0.5 % OPHTHALMIC SOLUTION    Place 1 drop into both eyes 2 (two) times daily.    Patient Care Team: Birdie Sons, MD as PCP - General (Family Medicine) Minna Merritts, MD as Consulting Physician (Cardiology) Kennieth Francois, MD (Dermatology)     Objective:   Vitals: BP 126/64 mmHg  Pulse 57  Temp(Src) 98.1 F (36.7 C) (Oral)  Resp 16  Ht 5' 10.5" (1.791 m)  Wt 163 lb (73.936 kg)  BMI 23.05 kg/m2  SpO2 97%  Physical Exam   General Appearance:    Alert, cooperative, no distress  Eyes:    PERRL, conjunctiva/corneas clear, EOM's intact       Lungs:     Clear to auscultation bilaterally, respirations unlabored  Heart:    Regular rate and rhythm  Neurologic:   Awake, alert, oriented x 3. No apparent focal neurological           defect.       Activities of Daily Living In your present state of health, do you have any difficulty performing the following activities: 09/03/2015  Hearing? N  Vision? Y  Difficulty concentrating or making decisions? N  Walking or climbing stairs? N  Dressing or bathing? N  Doing errands, shopping? N    Fall Risk Assessment Fall Risk  09/03/2015  Falls in the past year? No     Depression Screen PHQ 2/9 Scores 09/03/2015  PHQ - 2 Score 0    Cognitive Testing - 6-CIT  Correct? Score   What year is it? yes 0 0 or 4  What  month is it? yes 0 0 or 3  Memorize:    Pia Mau,  44,  [place],      What time is it? (within 1 hour) yes 0 0 or 3  Count backwards from 20 yes 0 0, 2, or 4  Name the months of the year yes 0 0, 2, or 4  Repeat name & address above no 3 0, 2, 4, 6, 8, or 10       TOTAL SCORE  3/28   Interpretation:  Normal  Normal (0-7) Abnormal (8-28)   Audit-C Alcohol Use Screening  Question Answer Points  How often do you have alcoholic drink? never 0  On days you do drink alcohol, how many drinks do you typically consume? n/a 0  How oftey will you drink 6 or more in a total? never 0  Total Score:  0   A score of 3 or more in women, and 4 or more in men indicates increased risk for alcohol abuse, EXCEPT if all of the points are from question 1.   Current Exercise Habits: Home exercise routine, Type of exercise: Other - see comments (Gardening and yard work), Time (Minutes): 60, Frequency (Times/Week): 6, Weekly Exercise (Minutes/Week): 360, Intensity: Mild Exercise limited by: None identified   Assessment & Plan:     Annual Wellness Visit  Reviewed patient's Family Medical History Reviewed and updated list of patient's medical providers Assessment of cognitive impairment was done Assessed patient's functional ability Established a written schedule for health screening [place] Completed and Reviewed  Exercise Activities and Dietary recommendations Goals    None      Immunization History  Administered Date(s) Administered  . Pneumococcal Conjugate-13 08/15/2013  . Pneumococcal Polysaccharide-23 04/06/2002  . Zoster 04/27/2013    Health Maintenance  Topic Date Due  . TETANUS/TDAP  12/01/1952  . INFLUENZA VACCINE  10/28/2015  . ZOSTAVAX  Completed  . PNA vac Low Risk Adult  Completed      Discussed health benefits of physical activity, and encouraged him to engage in regular exercise appropriate for his age and condition.      ------------------------------------------------------------------------------------------------------------ 1. Medicare annual wellness visit, subsequent Generally doing well.   2. Hypothyroidism, unspecified hypothyroidism type  - TSH  3. Allergic rhinitis, unspecified allergic rhinitis type Does well with singulair, but only using intermittently. Recommend he take this on a consistent basis  4. Chronic kidney disease, stage 3 (moderate)  - Renal function panel

## 2015-09-04 LAB — RENAL FUNCTION PANEL
ALBUMIN: 3.8 g/dL (ref 3.5–4.7)
BUN/Creatinine Ratio: 20 (ref 10–24)
BUN: 22 mg/dL (ref 8–27)
CO2: 24 mmol/L (ref 18–29)
Calcium: 9 mg/dL (ref 8.6–10.2)
Chloride: 102 mmol/L (ref 96–106)
Creatinine, Ser: 1.11 mg/dL (ref 0.76–1.27)
GFR, EST AFRICAN AMERICAN: 72 mL/min/{1.73_m2} (ref >59)
GFR, EST NON AFRICAN AMERICAN: 62 mL/min/{1.73_m2} (ref >59)
Glucose: 92 mg/dL (ref 65–99)
Phosphorus: 3.5 mg/dL (ref 2.5–4.5)
Potassium: 4.5 mmol/L (ref 3.5–5.2)
Sodium: 140 mmol/L (ref 134–144)

## 2015-09-04 LAB — TSH: TSH: 4.7 u[IU]/mL — ABNORMAL HIGH (ref 0.450–4.500)

## 2015-12-09 DIAGNOSIS — D2272 Melanocytic nevi of left lower limb, including hip: Secondary | ICD-10-CM | POA: Diagnosis not present

## 2015-12-09 DIAGNOSIS — D2261 Melanocytic nevi of right upper limb, including shoulder: Secondary | ICD-10-CM | POA: Diagnosis not present

## 2015-12-09 DIAGNOSIS — Z85828 Personal history of other malignant neoplasm of skin: Secondary | ICD-10-CM | POA: Diagnosis not present

## 2015-12-09 DIAGNOSIS — D225 Melanocytic nevi of trunk: Secondary | ICD-10-CM | POA: Diagnosis not present

## 2016-01-20 DIAGNOSIS — H2513 Age-related nuclear cataract, bilateral: Secondary | ICD-10-CM | POA: Diagnosis not present

## 2016-01-28 DIAGNOSIS — Z23 Encounter for immunization: Secondary | ICD-10-CM | POA: Diagnosis not present

## 2016-04-08 DIAGNOSIS — K5120 Ulcerative (chronic) proctitis without complications: Secondary | ICD-10-CM | POA: Diagnosis not present

## 2016-04-08 DIAGNOSIS — Z86010 Personal history of colonic polyps: Secondary | ICD-10-CM | POA: Diagnosis not present

## 2016-04-21 ENCOUNTER — Encounter: Payer: Self-pay | Admitting: Family Medicine

## 2016-04-21 ENCOUNTER — Ambulatory Visit (INDEPENDENT_AMBULATORY_CARE_PROVIDER_SITE_OTHER): Payer: Medicare Other | Admitting: Family Medicine

## 2016-04-21 ENCOUNTER — Ambulatory Visit
Admission: RE | Admit: 2016-04-21 | Discharge: 2016-04-21 | Disposition: A | Discharge status: Home / Self Care | Payer: Medicare Other | Source: Ambulatory Visit | Attending: Family Medicine | Admitting: Family Medicine

## 2016-04-21 VITALS — BP 122/60 | HR 59 | Temp 97.9°F | Resp 16 | Wt 169.0 lb

## 2016-04-21 DIAGNOSIS — R059 Cough, unspecified: Secondary | ICD-10-CM

## 2016-04-21 DIAGNOSIS — R918 Other nonspecific abnormal finding of lung field: Secondary | ICD-10-CM | POA: Diagnosis not present

## 2016-04-21 DIAGNOSIS — J984 Other disorders of lung: Secondary | ICD-10-CM

## 2016-04-21 DIAGNOSIS — R05 Cough: Secondary | ICD-10-CM

## 2016-04-21 DIAGNOSIS — I700 Atherosclerosis of aorta: Secondary | ICD-10-CM | POA: Insufficient documentation

## 2016-04-21 MED ORDER — MOMETASONE FUROATE 100 MCG/ACT IN AERO: 2.0000 | INHALATION_SPRAY | Freq: Every day | RESPIRATORY_TRACT | 0 refills | Status: DC

## 2016-04-21 NOTE — Patient Instructions (Signed)
Go to the [facility] on [address] for chest Xray  

## 2016-04-21 NOTE — Progress Notes (Signed)
Patient: Jesse Sandoval Male    DOB: 02-01-1932   83 y.o.   MRN: [id number] Visit Date: 04/21/2016  Today's Provider: Lelon Huh, MD   Chief Complaint  Patient presents with  . Cough   Subjective:    Cough  This is a new problem. Episode onset: 1 month ago. The problem has been waxing and waning. The cough is non-productive (dry cough). Associated symptoms include heartburn, nasal congestion, postnasal drip, rhinorrhea and wheezing. Pertinent negatives include no chest pain, chills, ear congestion, ear pain, eye redness, fever, headaches, hemoptysis, myalgias, sore throat, shortness of breath or sweats. Treatments tried: OTC Cough syrup. The treatment provided mild relief.   Had been having intermittent cough for several months before it worsened last month. Has been having a lot of trouble with wheezing which worsens in the middle of the night. Wakes up about 3am with wheezing in his chest. Was prescribed Singulair last summer which he states helped some, but never resolved. Is still taking intermittently.   Is having occasional trouble with heartburn, about three times a week.   Does not have humidifier. No peds       Allergies  Allergen Reactions  . Codeine   . Penicillins   . Sulfa Antibiotics      Current Outpatient Prescriptions:  .  levothyroxine (SYNTHROID, LEVOTHROID) 50 MCG tablet, TAKE ONE (1) TABLET BY MOUTH EVERY DAY (Patient taking differently: TAKE ONE (1) TABLET BY MOUTH EVERY OTHER DAY), Disp: 30 tablet, Rfl: 12 .  mesalamine (CANASA) 1000 MG suppository, Place 1,000 mg rectally as needed. , Disp: , Rfl:  .  montelukast (SINGULAIR) 10 MG tablet, Take 1 tablet (10 mg total) by mouth daily., Disp: 30 tablet, Rfl: 5 .  timolol (TIMOPTIC) 0.5 % ophthalmic solution, Place 1 drop into both eyes 2 (two) times daily., Disp: , Rfl:   Review of Systems  Constitutional: Negative for activity change, chills, diaphoresis, fatigue and fever.  HENT: Positive for  congestion, postnasal drip, rhinorrhea, sinus pressure, sneezing and voice change. Negative for ear pain and sore throat.   Eyes: Negative for photophobia, pain, discharge, redness, itching and visual disturbance.  Respiratory: Positive for cough, chest tightness and wheezing. Negative for hemoptysis and shortness of breath.   Cardiovascular: Negative for chest pain.  Gastrointestinal: Positive for heartburn.  Musculoskeletal: Negative for myalgias.  Neurological: Negative for headaches.    Social History  Substance Use Topics  . Smoking status: Never Smoker  . Smokeless tobacco: Never Used  . Alcohol use No   Objective:   BP 122/60 (BP Location: Left Arm, Patient Position: Sitting, Cuff Size: Normal)   Pulse (!) 59   Temp 97.9 F (36.6 C) (Oral)   Resp 16   Wt 169 lb (76.7 kg)   SpO2 98% Comment: room air  BMI 23.91 kg/m   Physical Exam  General Appearance:    Alert, cooperative, no distress  HENT:   bilateral TM normal without fluid or infection, neck without nodes, tonsils red, enlarged, with exudate present, sinuses nontender and nasal mucosa congested  Eyes:    PERRL, conjunctiva/corneas clear, EOM's intact       Lungs:     Clear to auscultation bilaterally, respirations unlabored  Heart:    Regular rate and rhythm  Neurologic:   Awake, alert, oriented x 3. No apparent focal neurological           defect.      Spirometry: Moderate restriction. Good test  Assessment & Plan:     1. Cough Chronic for at least 6 months. May be related to reflux or post nasal drainage, but low lung volumes concerneing for restrictive lung disease.  - Spirometry with graph; Future - Spirometry with graph - DG Chest 2 View; Future  2. Restrictive lung disease  - Mometasone Furoate (ASMANEX HFA) 100 MCG/ACT AERO; Inhale 2 puffs into the lungs at bedtime.  Dispense: 1 Inhaler; Refill: 0 - DG Chest 2 View; Future     The entirety of the information documented in the History of  Present Illness, Review of Systems and Physical Exam were personally obtained by me. Portions of this information were initially documented by Meyer Cory, CMA and reviewed by me for thoroughness and accuracy.    Lelon Huh, MD  [facility]

## 2016-04-22 ENCOUNTER — Telehealth: Payer: Self-pay

## 2016-04-22 NOTE — Telephone Encounter (Signed)
-----   Message from Birdie Sons, MD sent at 04/21/2016  5:40 PM EST ----- Chest xray looks good, no lung disease, some signs of chronic bronchitis, the inhaler should help with this. Continue sample inhaler two puffs every night and schedule follow up 2-3 weeks.

## 2016-04-22 NOTE — Telephone Encounter (Signed)
Patient advised as directed below. Patient scheduled 02/08 at 9:30 with Dr. Caryn Section

## 2016-05-06 ENCOUNTER — Ambulatory Visit (INDEPENDENT_AMBULATORY_CARE_PROVIDER_SITE_OTHER): Payer: Medicare Other | Admitting: Family Medicine

## 2016-05-06 ENCOUNTER — Encounter: Payer: Self-pay | Admitting: Family Medicine

## 2016-05-06 DIAGNOSIS — J984 Other disorders of lung: Secondary | ICD-10-CM

## 2016-05-06 MED ORDER — MOMETASONE FUROATE 100 MCG/ACT IN AERO: 2.0000 | INHALATION_SPRAY | Freq: Every day | RESPIRATORY_TRACT | 3 refills | Status: DC

## 2016-05-06 NOTE — Progress Notes (Signed)
       Patient: Jesse Sandoval Male    DOB: 01/22/1932   83 y.o.   MRN: [id number] Visit Date: 05/06/2016  Today's Provider: Lelon Huh, MD   Chief Complaint  Patient presents with  . Follow-up   Subjective:    HPI Follow up of Restrictive Lung Disease:  Patient was last seen for this problem on 04/21/2016. During that visit, a Spirometry was done. Patient was started on Asmanex (samples given). Chest x ray was ordered showing some signs of chronic bronchitis. Patient was advised to continue inhaler and follow up in 2-3 weeks.  Today patient comes in reporting that since using Asmanex his symptoms of congestion, cough and wheezing has improved but not completely resolved. Patient feels 75% better.     Allergies  Allergen Reactions  . Codeine   . Penicillins   . Sulfa Antibiotics      Current Outpatient Prescriptions:  .  levothyroxine (SYNTHROID, LEVOTHROID) 50 MCG tablet, TAKE ONE (1) TABLET BY MOUTH EVERY DAY (Patient taking differently: TAKE ONE (1) TABLET BY MOUTH EVERY OTHER DAY), Disp: 30 tablet, Rfl: 12 .  mesalamine (CANASA) 1000 MG suppository, Place 1,000 mg rectally as needed. , Disp: , Rfl:  .  Mometasone Furoate (ASMANEX HFA) 100 MCG/ACT AERO, Inhale 2 puffs into the lungs at bedtime., Disp: 1 Inhaler, Rfl: 0 .  montelukast (SINGULAIR) 10 MG tablet, Take 1 tablet (10 mg total) by mouth daily., Disp: 30 tablet, Rfl: 5 .  timolol (TIMOPTIC) 0.5 % ophthalmic solution, Place 1 drop into both eyes 2 (two) times daily., Disp: , Rfl:   Review of Systems  Constitutional: Negative for appetite change, chills and fever.  HENT: Positive for congestion.   Respiratory: Positive for cough (dry) and wheezing. Negative for chest tightness and shortness of breath.   Cardiovascular: Negative for chest pain and palpitations.  Gastrointestinal: Negative for abdominal pain, nausea and vomiting.    Social History  Substance Use Topics  . Smoking status: Never Smoker  . Smokeless  tobacco: Never Used  . Alcohol use No   Objective:   BP 132/60 (BP Location: Left Arm, Patient Position: Sitting, Cuff Size: Normal)   Pulse (!) 55   Temp 97.7 F (36.5 C) (Oral)   Resp 18   Wt 169 lb (76.7 kg)   SpO2 98% Comment: room air  BMI 23.91 kg/m   Physical Exam   General Appearance:    Alert, cooperative, no distress  Eyes:    PERRL, conjunctiva/corneas clear, EOM's intact       Lungs:     Occasional expiratory wheezes, no rales, respirations unlabored  Heart:    Regular rate and rhythm  Neurologic:   Awake, alert, oriented x 3. No apparent focal neurological           defect.           Assessment & Plan:     1. Restrictive lung disease Subjectively improved on steroid inhaler. Recommend he continue this for 2-3 months. Sent with rx to fill when samples run out.  - Mometasone Furoate (ASMANEX HFA) 100 MCG/ACT AERO; Inhale 2 puffs into the lungs at bedtime.  Dispense: 1 Inhaler; Refill: 3       Lelon Huh, MD  [facility]

## 2016-06-04 ENCOUNTER — Other Ambulatory Visit: Payer: Self-pay | Admitting: Family Medicine

## 2016-06-22 ENCOUNTER — Telehealth: Payer: Self-pay | Admitting: Family Medicine

## 2016-06-22 NOTE — Telephone Encounter (Signed)
Called Pt to schedule AWV with [facility] - knb Husband/wife medicare pt

## 2016-07-22 DIAGNOSIS — H40053 Ocular hypertension, bilateral: Secondary | ICD-10-CM | POA: Diagnosis not present

## 2016-09-03 ENCOUNTER — Ambulatory Visit (INDEPENDENT_AMBULATORY_CARE_PROVIDER_SITE_OTHER): Payer: Medicare Other

## 2016-09-03 VITALS — BP 142/52 | HR 64 | Temp 98.8°F | Ht 71.0 in | Wt 165.4 lb

## 2016-09-03 DIAGNOSIS — Z0000 Encounter for general adult medical examination without abnormal findings: Secondary | ICD-10-CM | POA: Diagnosis not present

## 2016-09-03 NOTE — Progress Notes (Signed)
Subjective:   Jesse Sandoval is a 83 y.o. male who presents for Medicare Annual/Subsequent preventive examination.  Review of Systems:  N/A  Cardiac Risk Factors include: advanced age (>34men, >56 women);male gender     Objective:    Vitals: BP (!) 142/52 (BP Location: Right Arm)   Pulse 64   Temp 98.8 F (37.1 C) (Oral)   Ht 5\' 11"  (1.803 m)   Wt 165 lb 6.4 oz (75 kg)   BMI 23.07 kg/m   Body mass index is 23.07 kg/m.  Tobacco History  Smoking Status  . Never Smoker  Smokeless Tobacco  . Never Used     Counseling given: Not Answered   Past Medical History:  Diagnosis Date  . Barrett's esophagus   . Colitis   . Diverticulosis   . Glaucoma   . History of colon polyps   . Prostate enlargement   . Prostatitis   . Skin cancer   . Ulcerative proctitis [facility])    Past Surgical History:  Procedure Laterality Date  . COLONOSCOPY WITH PROPOFOL N/A 01/03/2015   Procedure: COLONOSCOPY WITH PROPOFOL;  Surgeon: Manya Silvas, MD;  Location: [facility] ENDOSCOPY;  Service: Endoscopy;  Laterality: N/A;  . CYSTECTOMY     chest area on the right  . EXTERNAL EAR SURGERY    . HAND SURGERY    . HERNIA REPAIR     x3  . HERNIA REPAIR  [id number]  . PROSTATE SURGERY    . SEPTOPLASTY     Family History  Problem Relation Age of Onset  . Heart disease Mother   . Heart failure Mother   . Hypertension Mother   . Cancer Father        pancreatic   History  Sexual Activity  . Sexual activity: Not on file    Outpatient Encounter Prescriptions as of 09/03/2016  Medication Sig  . acetaminophen (TYLENOL) 500 MG tablet Take 500 mg by mouth every 6 (six) hours as needed.  Marland Kitchen levothyroxine (SYNTHROID, LEVOTHROID) 50 MCG tablet TAKE ONE TABLET BY MOUTH ONCE DAILY  . mesalamine (CANASA) 1000 MG suppository Place 1,000 mg rectally as needed.   . montelukast (SINGULAIR) 10 MG tablet Take 1 tablet (10 mg total) by mouth daily. (Patient taking differently: Take 10 mg by mouth daily. )  .  timolol (TIMOPTIC) 0.5 % ophthalmic solution Place 1 drop into both eyes 2 (two) times daily.  . [DISCONTINUED] Mometasone Furoate (ASMANEX HFA) 100 MCG/ACT AERO Inhale 2 puffs into the lungs at bedtime. (Patient not taking: Reported on 09/03/2016)   No facility-administered encounter medications on file as of 09/03/2016.     Activities of Daily Living In your present state of health, do you have any difficulty performing the following activities: 09/03/2016  Hearing? Y  Vision? Y  Difficulty concentrating or making decisions? N  Walking or climbing stairs? N  Dressing or bathing? N  Doing errands, shopping? N  Preparing Food and eating ? N  Using the Toilet? N  In the past six months, have you accidently leaked urine? Y  Do you have problems with loss of bowel control? N  Managing your Medications? N  Managing your Finances? N  Housekeeping or managing your Housekeeping? N  Some recent data might be hidden    Patient Care Team: Birdie Sons, MD as PCP - General (Family Medicine) Dasher, Rayvon Char, MD (Dermatology) Estill Cotta, MD as Consulting Physician (Ophthalmology)   Assessment:     Exercise Activities and  Dietary recommendations Current Exercise Habits: The patient does not participate in regular exercise at present (yardwork and gardening), Exercise limited by: None identified  Goals    . Increase water intake          Recommend increasing water intake to 4-6 glasses a day.       Fall Risk Fall Risk  09/03/2016 09/03/2015  Falls in the past year? Yes No  Number falls in past yr: 1 -  Injury with Fall? No -  Follow up Falls prevention discussed -   Depression Screen PHQ 2/9 Scores 09/03/2016 09/03/2016 09/03/2015  PHQ - 2 Score 0 0 0  PHQ- 9 Score 0 - -    Cognitive Function     6CIT Screen 09/03/2016  What Year? 0 points  What month? 0 points  What time? 0 points  Count back from 20 0 points  Months in reverse 0 points  Repeat phrase 6 points  Total Score  6    Immunization History  Administered Date(s) Administered  . Influenza, High Dose Seasonal PF 01/28/2016  . Pneumococcal Conjugate-13 08/15/2013  . Pneumococcal Polysaccharide-23 04/06/2002  . Zoster 04/27/2013   Screening Tests Health Maintenance  Topic Date Due  . TETANUS/TDAP  03/29/2026 (Originally 12/01/1950)  . INFLUENZA VACCINE  10/27/2016  . PNA vac Low Risk Adult  Completed      Plan:  I have personally reviewed and addressed the Medicare Annual Wellness questionnaire and have noted the following in the patient's chart:  A. Medical and social history B. Use of alcohol, tobacco or illicit drugs  C. Current medications and supplements D. Functional ability and status E.  Nutritional status F.  Physical activity G. Advance directives H. List of other physicians I.  Hospitalizations, surgeries, and ER visits in previous 12 months J.  [place] such as hearing and vision if needed, cognitive and depression L. Referrals and appointments - none  In addition, I have reviewed and discussed with patient certain preventive protocols, quality metrics, and best practice recommendations. A written personalized care plan for preventive services as well as general preventive health recommendations were provided to patient.  See attached scanned questionnaire for additional information.   Signed,  Fabio Neighbors, LPN Nurse Health Advisor   MD Recommendations: None. Pt declined tetanus today.

## 2016-09-03 NOTE — Patient Instructions (Signed)
Mr. Jesse Sandoval , Thank you for taking time to come for your Medicare Wellness Visit. I appreciate your ongoing commitment to your health goals. Please review the following plan we discussed and let me know if I can assist you in the future.   Screening recommendations/referrals: Colonoscopy: completed 01/03/15 Recommended yearly ophthalmology/optometry visit for glaucoma screening and checkup Recommended yearly dental visit for hygiene and checkup  Vaccinations: Influenza vaccine: up to date, due 11/2016 Pneumococcal vaccine: completed series Tdap vaccine: declined Shingles vaccine: completed 04/27/13   Advanced directives: Advance directive discussed with you today. Even though you declined this today please call our office should you change your mind and we can give you the proper paperwork for you to fill out.  Conditions/risks identified: Fall prevention; Recommend increasing water intake to 4-6 glasses a day.   Next appointment: 09/06/16 @ 10:00 AM  Preventive Care 65 Years and Older, Male Preventive care refers to lifestyle choices and visits with your health care provider that can promote health and wellness. What does preventive care include?  A yearly physical exam. This is also called an annual well check.  Dental exams once or twice a year.  Routine eye exams. Ask your health care provider how often you should have your eyes checked.  Personal lifestyle choices, including:  Daily care of your teeth and gums.  Regular physical activity.  Eating a healthy diet.  Avoiding tobacco and drug use.  Limiting alcohol use.  Practicing safe sex.  Taking low doses of aspirin every day.  Taking vitamin and mineral supplements as recommended by your health care provider. What happens during an annual well check? The services and screenings done by your health care provider during your annual well check will depend on your age, overall health, lifestyle risk factors, and family  history of disease. Counseling  Your health care provider may ask you questions about your:  Alcohol use.  Tobacco use.  Drug use.  Emotional well-being.  Home and relationship well-being.  Sexual activity.  Eating habits.  History of falls.  Memory and ability to understand (cognition).  Work and work Statistician. Screening  You may have the following tests or measurements:  Height, weight, and BMI.  Blood pressure.  Lipid and cholesterol levels. These may be checked every 5 years, or more frequently if you are over 71 years old.  Skin check.  Lung cancer screening. You may have this screening every year starting at age 63 if you have a 30-pack-year history of smoking and currently smoke or have quit within the past 15 years.  Fecal occult blood test (FOBT) of the stool. You may have this test every year starting at age 21.  Flexible sigmoidoscopy or colonoscopy. You may have a sigmoidoscopy every 5 years or a colonoscopy every 10 years starting at age 69.  Prostate cancer screening. Recommendations will vary depending on your family history and other risks.  Hepatitis C blood test.  Hepatitis B blood test.  Sexually transmitted disease (STD) testing.  Diabetes screening. This is done by checking your blood sugar (glucose) after you have not eaten for a while (fasting). You may have this done every 1-3 years.  Abdominal aortic aneurysm (AAA) screening. You may need this if you are a current or former smoker.  Osteoporosis. You may be screened starting at age 34 if you are at high risk. Talk with your health care provider about your test results, treatment options, and if necessary, the need for more tests. Vaccines  Your  health care provider may recommend certain vaccines, such as:  Influenza vaccine. This is recommended every year.  Tetanus, diphtheria, and acellular pertussis (Tdap, Td) vaccine. You may need a Td booster every 10 years.  Zoster vaccine.  You may need this after age 72.  Pneumococcal 13-valent conjugate (PCV13) vaccine. One dose is recommended after age 31.  Pneumococcal polysaccharide (PPSV23) vaccine. One dose is recommended after age 69. Talk to your health care provider about which screenings and vaccines you need and how often you need them. This information is not intended to replace advice given to you by your health care provider. Make sure you discuss any questions you have with your health care provider. Document Released: 04/11/2015 Document Revised: 12/03/2015 Document Reviewed: 01/14/2015 Elsevier Interactive Patient Education  2017 [place] Prevention in the Home Falls can cause injuries. They can happen to people of all ages. There are many things you can do to make your home safe and to help prevent falls. What can I do on the outside of my home?  Regularly fix the edges of walkways and driveways and fix any cracks.  Remove anything that might make you trip as you walk through a door, such as a raised step or threshold.  Trim any bushes or trees on the path to your home.  Use bright outdoor lighting.  Clear any walking paths of anything that might make someone trip, such as rocks or tools.  Regularly check to see if handrails are loose or broken. Make sure that both sides of any steps have handrails.  Any raised decks and porches should have guardrails on the edges.  Have any leaves, snow, or ice cleared regularly.  Use sand or salt on walking paths during winter.  Clean up any spills in your garage right away. This includes oil or grease spills. What can I do in the bathroom?  Use night lights.  Install grab bars by the toilet and in the tub and shower. Do not use towel bars as grab bars.  Use non-skid mats or decals in the tub or shower.  If you need to sit down in the shower, use a plastic, non-slip stool.  Keep the floor dry. Clean up any water that spills on the floor as soon  as it happens.  Remove soap buildup in the tub or shower regularly.  Attach bath mats securely with double-sided non-slip rug tape.  Do not have throw rugs and other things on the floor that can make you trip. What can I do in the bedroom?  Use night lights.  Make sure that you have a light by your bed that is easy to reach.  Do not use any sheets or blankets that are too big for your bed. They should not hang down onto the floor.  Have a firm chair that has side arms. You can use this for support while you get dressed.  Do not have throw rugs and other things on the floor that can make you trip. What can I do in the kitchen?  Clean up any spills right away.  Avoid walking on wet floors.  Keep items that you use a lot in easy-to-reach places.  If you need to reach something above you, use a strong step stool that has a grab bar.  Keep electrical cords out of the way.  Do not use floor polish or wax that makes floors slippery. If you must use wax, use non-skid floor wax.  Do not  have throw rugs and other things on the floor that can make you trip. What can I do with my stairs?  Do not leave any items on the stairs.  Make sure that there are handrails on both sides of the stairs and use them. Fix handrails that are broken or loose. Make sure that handrails are as long as the stairways.  Check any carpeting to make sure that it is firmly attached to the stairs. Fix any carpet that is loose or worn.  Avoid having throw rugs at the top or bottom of the stairs. If you do have throw rugs, attach them to the floor with carpet tape.  Make sure that you have a light switch at the top of the stairs and the bottom of the stairs. If you do not have them, ask someone to add them for you. What else can I do to help prevent falls?  Wear shoes that:  Do not have high heels.  Have rubber bottoms.  Are comfortable and fit you well.  Are closed at the toe. Do not wear sandals.  If  you use a stepladder:  Make sure that it is fully opened. Do not climb a closed stepladder.  Make sure that both sides of the stepladder are locked into place.  Ask someone to hold it for you, if possible.  Clearly mark and make sure that you can see:  Any grab bars or handrails.  First and last steps.  Where the edge of each step is.  Use tools that help you move around (mobility aids) if they are needed. These include:  Canes.  Walkers.  Scooters.  Crutches.  Turn on the lights when you go into a dark area. Replace any light bulbs as soon as they burn out.  Set up your furniture so you have a clear path. Avoid moving your furniture around.  If any of your floors are uneven, fix them.  If there are any pets around you, be aware of where they are.  Review your medicines with your doctor. Some medicines can make you feel dizzy. This can increase your chance of falling. Ask your doctor what other things that you can do to help prevent falls. This information is not intended to replace advice given to you by your health care provider. Make sure you discuss any questions you have with your health care provider. Document Released: 01/09/2009 Document Revised: 08/21/2015 Document Reviewed: 04/19/2014 Elsevier Interactive Patient Education  2017 [employer].

## 2016-09-06 ENCOUNTER — Encounter: Payer: Self-pay | Admitting: Family Medicine

## 2016-09-06 ENCOUNTER — Ambulatory Visit (INDEPENDENT_AMBULATORY_CARE_PROVIDER_SITE_OTHER): Payer: Medicare Other | Admitting: Family Medicine

## 2016-09-06 VITALS — BP 142/62 | HR 55 | Temp 98.0°F | Resp 16 | Ht 71.0 in | Wt 165.0 lb

## 2016-09-06 DIAGNOSIS — J42 Unspecified chronic bronchitis: Secondary | ICD-10-CM | POA: Diagnosis not present

## 2016-09-06 DIAGNOSIS — J301 Allergic rhinitis due to pollen: Secondary | ICD-10-CM

## 2016-09-06 DIAGNOSIS — E039 Hypothyroidism, unspecified: Secondary | ICD-10-CM | POA: Diagnosis not present

## 2016-09-06 DIAGNOSIS — J984 Other disorders of lung: Secondary | ICD-10-CM

## 2016-09-06 MED ORDER — FLUTICASONE PROPIONATE (INHAL) 100 MCG/BLIST IN AEPB: 2.0000 | INHALATION_SPRAY | Freq: Every day | RESPIRATORY_TRACT | 5 refills | Status: DC

## 2016-09-06 NOTE — Progress Notes (Signed)
Patient: Jesse Sandoval, Male    DOB: 1931-04-20, 83 y.o.   MRN: [id number] Visit Date: 09/06/2016  Today's Provider: Lelon Huh, MD   Chief Complaint  Patient presents with  . Hypothyroidism    follow up  . Chronic Kidney Disease    follow up   Subjective:   Follow up of Hypothyroidism:  Patient was last seen for this problem 1 year ago and no changes were made. Patient reports good compliance with treatment, and good tolerance.    Follow up of restrictive Lung Disease:  Patient was last seen for this problem 4 months ago. CXR showed changies suggestive of chronic bronchitis. Management during that visit includes advising patient to continue using steroid inhaler for 2-3 more months. Was also prescribed montelukast which he reports helped with nasal drainageToday patient comes in reporting that he finished the inhaler 3 months ago and is still having symptom of cough and sneezing. He states the inhaler helped, but didn't completely clear symptoms and is too expensive.   Review of Systems  Constitutional: Negative for appetite change, chills, fatigue and fever.  HENT: Positive for congestion, hearing loss, postnasal drip, sinus pressure, sneezing, sore throat and tinnitus. Negative for ear pain, nosebleeds and trouble swallowing.   Eyes: Positive for visual disturbance. Negative for pain.  Respiratory: Positive for choking and wheezing. Negative for cough, chest tightness and shortness of breath.   Cardiovascular: Negative for chest pain, palpitations and leg swelling.  Gastrointestinal: Negative for abdominal pain, blood in stool, constipation, diarrhea, nausea and vomiting.  Endocrine: Negative for polydipsia, polyphagia and polyuria.  Genitourinary: Positive for enuresis. Negative for dysuria and flank pain.  Musculoskeletal: Positive for arthralgias, back pain and myalgias. Negative for joint swelling and neck stiffness.  Skin: Negative for color change, rash and wound.    Neurological: Positive for weakness. Negative for dizziness, tremors, seizures, speech difficulty, light-headedness and headaches.  Psychiatric/Behavioral: Negative for behavioral problems, confusion, decreased concentration, dysphoric mood and sleep disturbance. The patient is not nervous/anxious.   All other systems reviewed and are negative.   Social History      He  reports that he has never smoked. He has never used smokeless tobacco. He reports that he does not drink alcohol or use drugs.       Social History   Social History  . Marital status: Married    Spouse name: N/A  . Number of children: 0  . Years of education: N/A   Occupational History  . Retired    Social History Main Topics  . Smoking status: Never Smoker  . Smokeless tobacco: Never Used  . Alcohol use No  . Drug use: No  . Sexual activity: Not Asked   Other Topics Concern  . None   Social History Narrative  . None       Patient Active Problem List   Diagnosis Date Noted  . Restrictive lung disease 04/21/2016  . Allergic rhinitis 08/15/2015  . Basal cell carcinoma of skin 08/15/2015  . Benign enlargement of prostate 08/15/2015  . Diverticulosis of colon 08/15/2015  . Dermatitis, eczematoid 08/15/2015  . Glaucoma 08/15/2015  . Adult hypothyroidism 08/15/2015  . Temporomandibular joint disorders 08/15/2015  . History of adenomatous polyp of colon 01/14/2015  . Chest tightness 06/26/2013  . Colitis 06/26/2013    Past Surgical History:  Procedure Laterality Date  . COLONOSCOPY WITH PROPOFOL N/A 01/03/2015   Procedure: COLONOSCOPY WITH PROPOFOL;  Surgeon: Manya Silvas, MD;  Location: [facility] ENDOSCOPY;  Service: Endoscopy;  Laterality: N/A;  . CYSTECTOMY     chest area on the right  . EXTERNAL EAR SURGERY    . HAND SURGERY    . HERNIA REPAIR     x3  . HERNIA REPAIR  [phone]  . PROSTATE SURGERY    . SEPTOPLASTY      Family History              His family history includes  Cancer in his father; Heart disease in his mother; Heart failure in his mother; Hypertension in his mother.     Allergies  Allergen Reactions  . Codeine   . Penicillins   . Sulfa Antibiotics      Current Outpatient Prescriptions:  .  acetaminophen (TYLENOL) 500 MG tablet, Take 500 mg by mouth every 6 (six) hours as needed., Disp: , Rfl:  .  levothyroxine (SYNTHROID, LEVOTHROID) 50 MCG tablet, TAKE ONE TABLET BY MOUTH ONCE DAILY, Disp: 30 tablet, Rfl: 12 .  mesalamine (CANASA) 1000 MG suppository, Place 1,000 mg rectally as needed. , Disp: , Rfl:  .  montelukast (SINGULAIR) 10 MG tablet, Take 1 tablet (10 mg total) by mouth daily. (Patient taking differently: Take 10 mg by mouth daily. ), Disp: 30 tablet, Rfl: 5 .  timolol (TIMOPTIC) 0.5 % ophthalmic solution, Place 1 drop into both eyes 2 (two) times daily., Disp: , Rfl:          Objective:   Vitals: BP (!) 142/62 (BP Location: Left Arm, Patient Position: Sitting, Cuff Size: Normal)   Pulse (!) 55   Temp 98 F (36.7 C) (Oral)   Resp 16   Ht 5\' 11"  (1.803 m)   Wt 165 lb (74.8 kg)   SpO2 98% Comment: room air  BMI 23.01 kg/m   There were no vitals filed for this visit.   Physical Exam   General Appearance:    Alert, cooperative, no distress  Eyes:    PERRL, conjunctiva/corneas clear, EOM's intact       Lungs:     Occasional mild expiratory wheezes.  respirations unlabored  Heart:    Regular rate and rhythm  Neurologic:   Awake, alert, oriented x 3. No apparent focal neurological           defect.        Depression Screen PHQ 2/9 Scores 09/03/2016 09/03/2016 09/03/2015  PHQ - 2 Score 0 0 0  PHQ- 9 Score 0 - -      Assessment & Plan:      1. Allergic rhinitis due to pollen, unspecified seasonality Improved with Singulair, can continue prn.   2. Chronic bronchitis, unspecified chronic bronchitis type ([place]) He states wheezing did improve when he was taking Asmanex but he is frustrated that he has to take every day and  states it is too expensive to take all the time. Discusses option of referral to pulmonology or trial of generic steroid inhaler. For now will try - Fluticasone Propionate, Inhal, 100 MCG/BLIST AEPB; Inhale 2 puffs into the lungs daily.  Dispense: 1 each; Refill: 5  3. Restrictive lung disease   4. Adult hypothyroidism Doing well with current dose of levothyroxine.  - T4 AND TSH     Lelon Huh, MD  [facility]

## 2016-09-06 NOTE — Patient Instructions (Addendum)
   Call back for referral to pulmonologist if wheezing does not improve on new inhaler.

## 2016-09-07 LAB — T4 AND TSH
T4, Total: 5.4 ug/dL (ref 4.5–12.0)
TSH: 5.63 u[IU]/mL — AB (ref 0.450–4.500)

## 2016-09-20 ENCOUNTER — Other Ambulatory Visit: Payer: Self-pay | Admitting: Family Medicine

## 2016-12-07 DIAGNOSIS — X32XXXA Exposure to sunlight, initial encounter: Secondary | ICD-10-CM | POA: Diagnosis not present

## 2016-12-07 DIAGNOSIS — L570 Actinic keratosis: Secondary | ICD-10-CM | POA: Diagnosis not present

## 2016-12-07 DIAGNOSIS — L820 Inflamed seborrheic keratosis: Secondary | ICD-10-CM | POA: Diagnosis not present

## 2016-12-07 DIAGNOSIS — L538 Other specified erythematous conditions: Secondary | ICD-10-CM | POA: Diagnosis not present

## 2016-12-07 DIAGNOSIS — R58 Hemorrhage, not elsewhere classified: Secondary | ICD-10-CM | POA: Diagnosis not present

## 2016-12-07 DIAGNOSIS — D2261 Melanocytic nevi of right upper limb, including shoulder: Secondary | ICD-10-CM | POA: Diagnosis not present

## 2016-12-07 DIAGNOSIS — D225 Melanocytic nevi of trunk: Secondary | ICD-10-CM | POA: Diagnosis not present

## 2016-12-07 DIAGNOSIS — Z85828 Personal history of other malignant neoplasm of skin: Secondary | ICD-10-CM | POA: Diagnosis not present

## 2016-12-07 DIAGNOSIS — D2272 Melanocytic nevi of left lower limb, including hip: Secondary | ICD-10-CM | POA: Diagnosis not present

## 2017-01-18 DIAGNOSIS — H40053 Ocular hypertension, bilateral: Secondary | ICD-10-CM | POA: Diagnosis not present

## 2017-02-01 ENCOUNTER — Ambulatory Visit (INDEPENDENT_AMBULATORY_CARE_PROVIDER_SITE_OTHER): Payer: Medicare Other | Admitting: Family Medicine

## 2017-02-01 DIAGNOSIS — Z23 Encounter for immunization: Secondary | ICD-10-CM | POA: Diagnosis not present

## 2017-05-13 ENCOUNTER — Other Ambulatory Visit: Payer: Self-pay | Admitting: Family Medicine

## 2017-07-14 DIAGNOSIS — H40053 Ocular hypertension, bilateral: Secondary | ICD-10-CM | POA: Diagnosis not present

## 2017-07-19 DIAGNOSIS — H40053 Ocular hypertension, bilateral: Secondary | ICD-10-CM | POA: Diagnosis not present

## 2017-07-21 ENCOUNTER — Encounter: Payer: Self-pay | Admitting: Family Medicine

## 2017-07-21 ENCOUNTER — Ambulatory Visit (INDEPENDENT_AMBULATORY_CARE_PROVIDER_SITE_OTHER): Payer: Medicare Other | Admitting: Family Medicine

## 2017-07-21 VITALS — BP 156/68 | HR 65 | Temp 98.5°F | Resp 20 | Wt 161.0 lb

## 2017-07-21 DIAGNOSIS — J42 Unspecified chronic bronchitis: Secondary | ICD-10-CM | POA: Diagnosis not present

## 2017-07-21 DIAGNOSIS — J984 Other disorders of lung: Secondary | ICD-10-CM

## 2017-07-21 DIAGNOSIS — J301 Allergic rhinitis due to pollen: Secondary | ICD-10-CM | POA: Diagnosis not present

## 2017-07-21 MED ORDER — ALBUTEROL SULFATE HFA 108 (90 BASE) MCG/ACT IN AERS: 2.0000 | INHALATION_SPRAY | Freq: Four times a day (QID) | RESPIRATORY_TRACT | 2 refills | Status: DC | PRN

## 2017-07-21 MED ORDER — FLUTICASONE PROPIONATE 50 MCG/ACT NA SUSP: 2.0000 | Freq: Every day | NASAL | 6 refills | Status: DC

## 2017-07-21 MED ORDER — FLUTICASONE PROPIONATE (INHAL) 100 MCG/BLIST IN AEPB: 2.0000 | INHALATION_SPRAY | Freq: Every day | RESPIRATORY_TRACT | 5 refills | Status: DC

## 2017-07-21 NOTE — Patient Instructions (Signed)

## 2017-07-21 NOTE — Progress Notes (Signed)
Patient: Jesse Sandoval Male    DOB: 10/31/1931   84 y.o.   MRN: [id number] Visit Date: 07/21/2017  Today's Provider: Lavon Paganini, MD   I, Martha Clan, CMA, am acting as scribe for Lavon Paganini, MD.  Chief Complaint  Patient presents with  . Sinus Problem   Subjective:    Sinus Problem  This is a new problem. Episode onset: yesterday after mowing lawn. The problem has been gradually worsening since onset. Associated symptoms include congestion, coughing, headaches (possibly from Singulair?), a hoarse voice, shortness of breath (and wheezing. He has a H/O "asthma", and is taking Singulair. He is not taking Flovent because it has expired, and is requesting a refill if this would would help sx), sinus pressure and sneezing. Pertinent negatives include no diaphoresis, ear pain, neck pain, sore throat or swollen glands. Treatments tried: Singulair. The treatment provided no relief.   Has been using Flovent infrequently within the last few months.  He was concerned by SOB, wheezing, sinus congestion after mowing the lawn yesterday.  He thinks he is doing better today than previously     Allergies  Allergen Reactions  . Codeine   . Penicillins   . Sulfa Antibiotics      Current Outpatient Medications:  .  acetaminophen (TYLENOL) 500 MG tablet, Take 500 mg by mouth every 6 (six) hours as needed., Disp: , Rfl:  .  levothyroxine (SYNTHROID, LEVOTHROID) 50 MCG tablet, TAKE ONE TABLET BY MOUTH ONCE DAILY, Disp: 30 tablet, Rfl: 12 .  mesalamine (CANASA) 1000 MG suppository, Place 1,000 mg rectally as needed. , Disp: , Rfl:  .  montelukast (SINGULAIR) 10 MG tablet, TAKE 1 TABLET BY MOUTH ONCE DAILY, Disp: 30 tablet, Rfl: 12 .  timolol (TIMOPTIC) 0.5 % ophthalmic solution, Place 1 drop into both eyes 2 (two) times daily., Disp: , Rfl:  .  Fluticasone Propionate, Inhal, 100 MCG/BLIST AEPB, Inhale 2 puffs into the lungs daily. (Patient not taking: Reported on 07/21/2017), Disp: 1  each, Rfl: 5  Review of Systems  Constitutional: Negative for diaphoresis.  HENT: Positive for congestion, hoarse voice, sinus pressure and sneezing. Negative for ear pain and sore throat.   Respiratory: Positive for cough and shortness of breath (and wheezing. He has a H/O "asthma", and is taking Singulair. He is not taking Flovent because it has expired, and is requesting a refill if this would would help sx).   Musculoskeletal: Negative for neck pain.  Neurological: Positive for headaches (possibly from Singulair?).    Social History   Tobacco Use  . Smoking status: Never Smoker  . Smokeless tobacco: Never Used  Substance Use Topics  . Alcohol use: No   Objective:   BP (!) 156/68 (BP Location: Left Arm, Patient Position: Sitting, Cuff Size: Normal)   Pulse 65   Temp 98.5 F (36.9 C) (Oral)   Resp 20   Wt 161 lb (73 kg)   SpO2 94%   BMI 22.45 kg/m  Vitals:   07/21/17 0850  BP: (!) 156/68  Pulse: 65  Resp: 20  Temp: 98.5 F (36.9 C)  TempSrc: Oral  SpO2: 94%  Weight: 161 lb (73 kg)     Physical Exam  Constitutional: He is oriented to person, place, and time. He appears well-developed and well-nourished. No distress.  HENT:  Head: Normocephalic and atraumatic.  Right Ear: Tympanic membrane, external ear and ear canal normal.  Left Ear: Tympanic membrane, external ear and ear canal normal.  Nose: Mucosal edema and rhinorrhea present. Right sinus exhibits no maxillary sinus tenderness and no frontal sinus tenderness. Left sinus exhibits no maxillary sinus tenderness and no frontal sinus tenderness.  Mouth/Throat: Uvula is midline, oropharynx is clear and moist and mucous membranes are normal. No oropharyngeal exudate, posterior oropharyngeal edema or posterior oropharyngeal erythema.  Eyes: Pupils are equal, round, and reactive to light. Conjunctivae and EOM are normal. Right eye exhibits no discharge. Left eye exhibits no discharge. No scleral icterus.  Neck: Neck  supple. No thyromegaly present.  Cardiovascular: Normal rate, regular rhythm, normal heart sounds and intact distal pulses.  No murmur heard. Pulmonary/Chest: Effort normal and breath sounds normal. No respiratory distress. He has no wheezes. He has no rales.  Musculoskeletal: He exhibits no edema.  Lymphadenopathy:    He has no cervical adenopathy.  Neurological: He is alert and oriented to person, place, and time.  Skin: Skin is warm and dry. Capillary refill takes less than 2 seconds. No rash noted.  Psychiatric: He has a normal mood and affect. His behavior is normal.  Vitals reviewed.      Assessment & Plan:   1. Allergic rhinitis due to pollen, unspecified seasonality - sinus congestion, rhinorrhea and post-nasal drip exacerbated by mowing the lawn likely allergic in nature - advised to resume OTC antihistamine, continue singulair and try flonase - discussed environmental precautions  2. Chronic bronchitis, unspecified chronic bronchitis type ([place]) - lungs clear today, but reports wheezing and dyspnea - resume flovent regularly and trial of albuterol prn - Fluticasone Propionate, Inhal, 100 MCG/BLIST AEPB; Inhale 2 puffs into the lungs daily.  Dispense: 1 each; Refill: 5  3. Restrictive lung disease - see plan above for chronic bronchitis     Meds ordered this encounter  Medications  . Fluticasone Propionate, Inhal, 100 MCG/BLIST AEPB    Sig: Inhale 2 puffs into the lungs daily.    Dispense:  1 each    Refill:  5  . fluticasone (FLONASE) 50 MCG/ACT nasal spray    Sig: Place 2 sprays into both nostrils daily.    Dispense:  16 g    Refill:  6  . albuterol (PROVENTIL HFA;VENTOLIN HFA) 108 (90 Base) MCG/ACT inhaler    Sig: Inhale 2 puffs into the lungs every 6 (six) hours as needed for wheezing or shortness of breath.    Dispense:  1 Inhaler    Refill:  2     Return if symptoms worsen or fail to improve.   The entirety of the information documented in the History  of Present Illness, Review of Systems and Physical Exam were personally obtained by me. Portions of this information were initially documented by Raquel Sarna Ratchford, CMA and reviewed by me for thoroughness and accuracy.    Virginia Crews, MD, MPH [facility] 07/21/2017 9:47 AM

## 2017-08-11 ENCOUNTER — Other Ambulatory Visit: Payer: Self-pay | Admitting: Family Medicine

## 2017-08-11 NOTE — Telephone Encounter (Signed)
Pharmacy requesting refills. Thanks!  

## 2017-08-12 ENCOUNTER — Telehealth: Payer: Self-pay

## 2017-08-25 NOTE — Telephone Encounter (Signed)
Called pt to set up AWV and CPE. Pt declined the AWV and states he had this last year and feels its unnecessary this year. Scheduled pt for a CPE on 09/15/17 @ 9 AM. FYI to PCP. -MM

## 2017-09-14 NOTE — Progress Notes (Signed)
Patient: Jesse Sandoval, Male    DOB: 1931-04-21, 84 y.o.   MRN: [id number] Visit Date: 09/15/2017  Today's Provider: Lelon Huh, MD   Chief Complaint  Patient presents with  . Medicare Wellness   Subjective:    Annual wellness visit Jesse Sandoval is a 84 y.o. male. He feels fairly well. He reports exercising active with daily activies. He reports he is sleeping well. 01/03/15 Colonoscopy-polyps, internal hemorrhoids -----------------------------------------------------------  Adult hypothyroidism From 09/06/2016-labs checked, no changes.  Patient reports good tolerance and compliance with medication.  Complains of frequent weak spells that last an hour or so several time a week. No associated chest pain, or shortness. Improves after laying on back for a little bit.   Lab Results  Component Value Date   TSH 5.630 (H) 09/06/2016    Restrictive lung disease From 07/21/2017-treated chronic bronchitis with otc antihistamine, Singulair and Flonase.  Patient reports using Flovent Diskus daily.  Also has frequent pain in lower back, had xr 2015 showing mild degenerative change.   Review of Systems  Constitutional: Positive for fatigue.  HENT: Positive for congestion, postnasal drip, rhinorrhea, sinus pain and sneezing.   Eyes: Negative.   Respiratory: Positive for cough and wheezing.   Cardiovascular: Negative.   Gastrointestinal: Negative.   Endocrine: Positive for polyuria.  Genitourinary: Positive for enuresis.  Musculoskeletal: Positive for arthralgias, back pain and myalgias.  Skin: Negative.   Allergic/Immunologic: Negative.   Neurological: Negative.   Hematological: Bruises/bleeds easily.  Psychiatric/Behavioral: Negative.     Social History   Socioeconomic History  . Marital status: Married    Spouse name: Not on file  . Number of children: 0  . Years of education: Not on file  . Highest education level: Not on file  Occupational History  . Occupation:  Retired  Scientific laboratory technician  . Financial resource strain: Not on file  . Food insecurity:    Worry: Not on file    Inability: Not on file  . Transportation needs:    Medical: Not on file    Non-medical: Not on file  Tobacco Use  . Smoking status: Never Smoker  . Smokeless tobacco: Never Used  Substance and Sexual Activity  . Alcohol use: No  . Drug use: No  . Sexual activity: Not on file  Lifestyle  . Physical activity:    Days per week: Not on file    Minutes per session: Not on file  . Stress: Not on file  Relationships  . Social connections:    Talks on phone: Not on file    Gets together: Not on file    Attends religious service: Not on file    Active member of club or organization: Not on file    Attends meetings of clubs or organizations: Not on file    Relationship status: Not on file  . Intimate partner violence:    Fear of current or ex partner: Not on file    Emotionally abused: Not on file    Physically abused: Not on file    Forced sexual activity: Not on file  Other Topics Concern  . Not on file  Social History Narrative  . Not on file    Past Medical History:  Diagnosis Date  . Barrett's esophagus   . Colitis 06/26/2013  . Diverticulosis   . Glaucoma   . History of colon polyps   . Prostate enlargement   . Prostatitis   . Skin cancer   .  Temporomandibular joint disorders 08/15/2015  . Ulcerative proctitis [facility])      Patient Active Problem List   Diagnosis Date Noted  . Chronic bronchitis ([place]) 09/06/2016  . Restrictive lung disease 04/21/2016  . Allergic rhinitis 08/15/2015  . Basal cell carcinoma of skin 08/15/2015  . Benign enlargement of prostate 08/15/2015  . Diverticulosis of colon 08/15/2015  . Dermatitis, eczematoid 08/15/2015  . Glaucoma 08/15/2015  . Adult hypothyroidism 08/15/2015  . History of adenomatous polyp of colon 01/14/2015  . Chest tightness 06/26/2013    Past Surgical History:  Procedure Laterality Date  . COLONOSCOPY  WITH PROPOFOL N/A 01/03/2015   Procedure: COLONOSCOPY WITH PROPOFOL;  Surgeon: Manya Silvas, MD;  Location: [facility] ENDOSCOPY;  Service: Endoscopy;  Laterality: N/A;  . CYSTECTOMY     chest area on the right  . EXTERNAL EAR SURGERY    . HAND SURGERY    . HERNIA REPAIR     x3  . HERNIA REPAIR  [phone]  . PROSTATE SURGERY    . SEPTOPLASTY      His family history includes Cancer in his father; Heart disease in his mother; Heart failure in his mother; Hypertension in his mother.      Current Outpatient Medications:  .  acetaminophen (TYLENOL) 500 MG tablet, Take 500 mg by mouth every 6 (six) hours as needed., Disp: , Rfl:  .  albuterol (PROVENTIL HFA;VENTOLIN HFA) 108 (90 Base) MCG/ACT inhaler, Inhale 2 puffs into the lungs every 6 (six) hours as needed for wheezing or shortness of breath., Disp: 1 Inhaler, Rfl: 2 .  fluticasone (FLONASE) 50 MCG/ACT nasal spray, Place 2 sprays into both nostrils daily., Disp: 16 g, Rfl: 6 .  Fluticasone Propionate, Inhal, 100 MCG/BLIST AEPB, Inhale 2 puffs into the lungs daily., Disp: 1 each, Rfl: 5 .  levothyroxine (SYNTHROID, LEVOTHROID) 50 MCG tablet, TAKE 1 TABLET BY MOUTH ONCE DAILY, Disp: 30 tablet, Rfl: 0 .  mesalamine (CANASA) 1000 MG suppository, Place 1,000 mg rectally as needed. , Disp: , Rfl:  .  montelukast (SINGULAIR) 10 MG tablet, TAKE 1 TABLET BY MOUTH ONCE DAILY, Disp: 30 tablet, Rfl: 12 .  timolol (TIMOPTIC) 0.5 % ophthalmic solution, Place 1 drop into both eyes 2 (two) times daily., Disp: , Rfl:   Patient Care Team: Birdie Sons, MD as PCP - General (Family Medicine) Dasher, Rayvon Char, MD (Dermatology) Dingeldein, Remo Lipps, MD as Consulting Physician (Ophthalmology)     Objective:   Vitals: BP 120/70 (BP Location: Left Arm, Patient Position: Sitting, Cuff Size: Normal)   Pulse (!) 56   Temp 97.9 F (36.6 C) (Oral)   Resp 16   Ht 5\' 11"  (1.803 m)   Wt 160 lb (72.6 kg)   SpO2 96%   BMI 22.32 kg/m   Physical  Exam   General Appearance:    Alert, cooperative, no distress, appears stated age  Head:    Normocephalic, without obvious abnormality, atraumatic  Eyes:    PERRL, conjunctiva/corneas clear, EOM's intact, fundi    benign, both eyes       Ears:    Normal TM's and external ear canals, both ears  Nose:   Nares normal, septum midline, mucosa normal, no drainage   or sinus tenderness  Throat:   Lips, mucosa, and tongue normal; teeth and gums normal  Neck:   Supple, symmetrical, trachea midline, no adenopathy;       thyroid:  No enlargement/tenderness/nodules; no carotid   bruit or JVD  Back:  Symmetric, no curvature, ROM normal, no CVA tenderness  Lungs:     Clear to auscultation bilaterally, respirations unlabored  Chest wall:    No tenderness or deformity  Heart:    Regular rate and rhythm, S1 and S2 normal, no murmur, rub   or gallop  Abdomen:     Soft, non-tender, bowel sounds active all four quadrants,    no masses, no organomegaly  Genitalia:    deferred  Rectal:    deferred  Extremities:   Extremities normal, atraumatic, no cyanosis or edema  Pulses:   2+ and symmetric all extremities  Skin:   Skin color, texture, turgor normal, no rashes or lesions  Lymph nodes:   Cervical, supraclavicular, and axillary nodes normal  Neurologic:   CNII-XII intact. Normal strength, sensation and reflexes      throughout   EKG: First degree AV block   Activities of Daily Living In your present state of health, do you have any difficulty performing the following activities: 09/15/2017  Hearing? Y  Vision? Y  Difficulty concentrating or making decisions? N  Walking or climbing stairs? Y  Dressing or bathing? N  Doing errands, shopping? N  Some recent data might be hidden    Fall Risk Assessment Fall Risk  09/15/2017 09/03/2016 09/03/2015  Falls in the past year? No Yes No  Number falls in past yr: - 1 -  Injury with Fall? - No -  Follow up - Falls prevention discussed -     Depression  Screen PHQ 2/9 Scores 09/15/2017 09/03/2016 09/03/2016 09/03/2015  PHQ - 2 Score 0 0 0 0  PHQ- 9 Score 3 0 - -    Cognitive Testing - 6-CIT  Correct? Score   What year is it? yes 0 0 or 4  What month is it? yes 0 0 or 3  Memorize:    Pia Mau,  44,  [address]  [place],      What time is it? (within 1 hour) yes 0 0 or 3  Count backwards from 20 yes 0 0, 2, or 4  Name the months of the year yes 0 0, 2, or 4  Repeat name & address above no 2 0, 2, 4, 6, 8, or 10       TOTAL SCORE  2/28   Interpretation:  Normal  Normal (0-7) Abnormal (8-28)       Assessment & Plan:     Annual Wellness Visit  Reviewed patient's Family Medical History Reviewed and updated list of patient's medical providers Assessment of cognitive impairment was done Assessed patient's functional ability Established a written schedule for health screening [place] Completed and Reviewed  Exercise Activities and Dietary recommendations Goals    None      Immunization History  Administered Date(s) Administered  . Influenza, High Dose Seasonal PF 01/28/2016, 02/01/2017  . Pneumococcal Conjugate-13 08/15/2013  . Pneumococcal Polysaccharide-23 04/06/2002  . Zoster 04/27/2013    Health Maintenance  Topic Date Due  . TETANUS/TDAP  12/01/1952  . INFLUENZA VACCINE  10/27/2017  . PNA vac Low Risk Adult  Completed     Discussed health benefits of physical activity, and encouraged him to engage in regular exercise appropriate for his age and condition.    ------------------------------------------------------------------------------------------------------------  1. Medicare annual wellness visit, subsequent   2. Restrictive lung disease Continue current inhalers.   3. Adult hypothyroidism - TSH  4. History of adenomatous polyp of colon Up to date on colonoscopy  5. Weakness Check labs. Episodic, possible secondary to cardiac arrythmia. Obtain Holter.  - TSH - EKG  12-Lead - Holter monitor - 48 hour; Future - Comprehensive metabolic panel - CBC  6. Heart block AV first degree Newly diagnosed.  - Holter monitor - 48 hour; Future   Lelon Huh, MD  [facility]

## 2017-09-15 ENCOUNTER — Ambulatory Visit (INDEPENDENT_AMBULATORY_CARE_PROVIDER_SITE_OTHER): Payer: Medicare Other | Admitting: Family Medicine

## 2017-09-15 ENCOUNTER — Encounter: Payer: Self-pay | Admitting: Family Medicine

## 2017-09-15 VITALS — BP 120/70 | HR 56 | Temp 97.9°F | Resp 16 | Ht 71.0 in | Wt 160.0 lb

## 2017-09-15 DIAGNOSIS — Z0000 Encounter for general adult medical examination without abnormal findings: Secondary | ICD-10-CM | POA: Diagnosis not present

## 2017-09-15 DIAGNOSIS — I440 Atrioventricular block, first degree: Secondary | ICD-10-CM | POA: Diagnosis not present

## 2017-09-15 DIAGNOSIS — J984 Other disorders of lung: Secondary | ICD-10-CM | POA: Diagnosis not present

## 2017-09-15 DIAGNOSIS — R531 Weakness: Secondary | ICD-10-CM | POA: Diagnosis not present

## 2017-09-15 DIAGNOSIS — Z86010 Personal history of colonic polyps: Secondary | ICD-10-CM

## 2017-09-15 DIAGNOSIS — E039 Hypothyroidism, unspecified: Secondary | ICD-10-CM | POA: Diagnosis not present

## 2017-09-15 NOTE — Patient Instructions (Addendum)
   The CDC recommends two doses of Shingrix (the shingles vaccine) separated by 2 to 6 months for adults age 82 years and older. I recommend checking with your insurance plan regarding coverage for this vaccine.    You can call for referral to physical therapy at your convenience

## 2017-09-16 LAB — COMPREHENSIVE METABOLIC PANEL
A/G RATIO: 1.4 (ref 1.2–2.2)
ALBUMIN: 3.8 g/dL (ref 3.5–4.7)
ALT: 13 IU/L (ref 0–44)
AST: 20 IU/L (ref 0–40)
Alkaline Phosphatase: 93 IU/L (ref 39–117)
BUN/Creatinine Ratio: 20 (ref 10–24)
BUN: 21 mg/dL (ref 8–27)
Bilirubin Total: 0.3 mg/dL (ref 0.0–1.2)
CALCIUM: 8.8 mg/dL (ref 8.6–10.2)
CO2: 23 mmol/L (ref 20–29)
CREATININE: 1.06 mg/dL (ref 0.76–1.27)
Chloride: 106 mmol/L (ref 96–106)
GFR, EST AFRICAN AMERICAN: 75 mL/min/{1.73_m2} (ref >59)
GFR, EST NON AFRICAN AMERICAN: 65 mL/min/{1.73_m2} (ref >59)
GLOBULIN, TOTAL: 2.7 g/dL (ref 1.5–4.5)
Glucose: 82 mg/dL (ref 65–99)
POTASSIUM: 4.5 mmol/L (ref 3.5–5.2)
SODIUM: 142 mmol/L (ref 134–144)
TOTAL PROTEIN: 6.5 g/dL (ref 6.0–8.5)

## 2017-09-16 LAB — CBC
Hematocrit: 36.7 % — ABNORMAL LOW (ref 37.5–51.0)
Hemoglobin: 11.9 g/dL — ABNORMAL LOW (ref 13.0–17.7)
MCH: 30.5 pg (ref 26.6–33.0)
MCHC: 32.4 g/dL (ref 31.5–35.7)
MCV: 94 fL (ref 79–97)
PLATELETS: 235 10*3/uL (ref 150–450)
RBC: 3.9 x10E6/uL — ABNORMAL LOW (ref 4.14–5.80)
RDW: 14.3 % (ref 12.3–15.4)
WBC: 5.8 10*3/uL (ref 3.4–10.8)

## 2017-09-16 LAB — TSH: TSH: 4.68 u[IU]/mL — ABNORMAL HIGH (ref 0.450–4.500)

## 2017-10-04 ENCOUNTER — Ambulatory Visit
Admission: RE | Admit: 2017-10-04 | Discharge: 2017-10-04 | Disposition: A | Discharge status: Home / Self Care | Payer: Medicare Other | Source: Ambulatory Visit | Attending: Family Medicine | Admitting: Family Medicine

## 2017-10-04 DIAGNOSIS — I440 Atrioventricular block, first degree: Secondary | ICD-10-CM

## 2017-10-04 DIAGNOSIS — R531 Weakness: Secondary | ICD-10-CM | POA: Insufficient documentation

## 2017-10-08 ENCOUNTER — Ambulatory Visit
Admission: RE | Admit: 2017-10-08 | Discharge: 2017-10-08 | Disposition: A | Discharge status: Home / Self Care | Payer: Medicare Other | Source: Ambulatory Visit | Attending: Family Medicine | Admitting: Family Medicine

## 2017-10-08 DIAGNOSIS — R531 Weakness: Secondary | ICD-10-CM | POA: Diagnosis not present

## 2017-10-08 DIAGNOSIS — I440 Atrioventricular block, first degree: Secondary | ICD-10-CM | POA: Insufficient documentation

## 2017-10-08 NOTE — CV Procedure (Signed)
48-hour Holter Date of hookup October 04, 2017 Indication palpitations Referring physician Dr. Lelon Huh Patient with a Holter for 45 hours  Total beats 169,838 Minimum rate 46 maximum 114 average 63 Mostly sinus rhythm No significant PVCs Frequent PACs with occasional couplets No runs No pauses No high-grade block No ST segment changes No diary submitted  Conclusion Benign 48-hour Holter except for frequent PACs Consider data blocker or calcium blocker therapy if symptoms persist

## 2017-10-12 ENCOUNTER — Other Ambulatory Visit: Payer: Self-pay | Admitting: Family Medicine

## 2017-10-12 ENCOUNTER — Telehealth: Payer: Self-pay | Admitting: *Deleted

## 2017-10-12 NOTE — Telephone Encounter (Signed)
No answer and no vm. Will try again later.  

## 2017-10-12 NOTE — Telephone Encounter (Signed)
-----   Message from Birdie Sons, MD sent at 10/12/2017 11:26 AM EDT ----- Please advise patient that monitor shows he does not have any heart arrhythmias. No further evaluation is needed.

## 2017-10-14 NOTE — Telephone Encounter (Signed)
Pt advised.   Thanks,   -Laura  

## 2017-12-05 ENCOUNTER — Telehealth: Payer: Self-pay | Admitting: Family Medicine

## 2017-12-05 DIAGNOSIS — D649 Anemia, unspecified: Secondary | ICD-10-CM

## 2017-12-05 NOTE — Telephone Encounter (Signed)
Patient was advised. Lab slip printed.

## 2017-12-05 NOTE — Telephone Encounter (Signed)
Time to check cbc for follow up anemia. Please print and leave lab order for pt. Thanks.

## 2017-12-06 DIAGNOSIS — Z85828 Personal history of other malignant neoplasm of skin: Secondary | ICD-10-CM | POA: Diagnosis not present

## 2017-12-06 DIAGNOSIS — Z08 Encounter for follow-up examination after completed treatment for malignant neoplasm: Secondary | ICD-10-CM | POA: Diagnosis not present

## 2017-12-06 DIAGNOSIS — L570 Actinic keratosis: Secondary | ICD-10-CM | POA: Diagnosis not present

## 2017-12-06 DIAGNOSIS — D485 Neoplasm of uncertain behavior of skin: Secondary | ICD-10-CM | POA: Diagnosis not present

## 2017-12-06 DIAGNOSIS — L821 Other seborrheic keratosis: Secondary | ICD-10-CM | POA: Diagnosis not present

## 2017-12-06 DIAGNOSIS — X32XXXA Exposure to sunlight, initial encounter: Secondary | ICD-10-CM | POA: Diagnosis not present

## 2017-12-06 DIAGNOSIS — D649 Anemia, unspecified: Secondary | ICD-10-CM | POA: Diagnosis not present

## 2017-12-07 LAB — CBC
HEMATOCRIT: 36.2 % — AB (ref 37.5–51.0)
HEMOGLOBIN: 12 g/dL — AB (ref 13.0–17.7)
MCH: 31 pg (ref 26.6–33.0)
MCHC: 33.1 g/dL (ref 31.5–35.7)
MCV: 94 fL (ref 79–97)
Platelets: 261 10*3/uL (ref 150–450)
RBC: 3.87 x10E6/uL — AB (ref 4.14–5.80)
RDW: 12.8 % (ref 12.3–15.4)
WBC: 6.8 10*3/uL (ref 3.4–10.8)

## 2017-12-08 ENCOUNTER — Telehealth: Payer: Self-pay

## 2017-12-08 NOTE — Telephone Encounter (Signed)
-----   Message from Birdie Sons, MD sent at 12/08/2017  8:01 AM EDT ----- Slightly anemia, but better. Need to check with yearly blood work. Can go ahead and schedule yearly wellness visit in June.

## 2017-12-08 NOTE — Telephone Encounter (Signed)
Tried calling patient. No answer or voice message system. Will try calling again later.

## 2017-12-13 NOTE — Telephone Encounter (Signed)
Patient advised as below.  

## 2018-01-16 DIAGNOSIS — H2513 Age-related nuclear cataract, bilateral: Secondary | ICD-10-CM | POA: Diagnosis not present

## 2018-01-16 DIAGNOSIS — H40053 Ocular hypertension, bilateral: Secondary | ICD-10-CM | POA: Diagnosis not present

## 2018-01-20 ENCOUNTER — Ambulatory Visit (INDEPENDENT_AMBULATORY_CARE_PROVIDER_SITE_OTHER): Payer: Medicare Other | Admitting: Family Medicine

## 2018-01-20 ENCOUNTER — Encounter: Payer: Self-pay | Admitting: Family Medicine

## 2018-01-20 VITALS — BP 122/56 | HR 72 | Temp 98.5°F | Resp 16 | Wt 161.6 lb

## 2018-01-20 DIAGNOSIS — J069 Acute upper respiratory infection, unspecified: Secondary | ICD-10-CM

## 2018-01-20 MED ORDER — HYDROCODONE-HOMATROPINE 5-1.5 MG/5ML PO SYRP: 5.0000 mL | ORAL_SOLUTION | Freq: Four times a day (QID) | ORAL | 0 refills | Status: AC | PRN

## 2018-01-20 NOTE — Progress Notes (Deleted)
ough

## 2018-01-20 NOTE — Patient Instructions (Signed)
Discussed use of Mucinex D for congestion. Schedule albuterol every 4-6 hours while sick. Use cough syrup to sleep at night. Let us know if increased shortness of breath or not improving over the next few days.

## 2018-01-20 NOTE — Progress Notes (Signed)
  Subjective:     Patient ID: Jesse Sandoval, male   DOB: 1931-11-02, 84 y.o.   MRN: [id number] Chief Complaint  Patient presents with  . Cough    Patient comes in office today with complaints of a dry cough for the past "several days". Patient reports that symptoms initially began as a sore throat that developed into cough, he reports difficulty sleeping through the night. Associated with cough patient complains of runny nose, congestion, sinus pain, wheezing and shortness of breath. Patient has been using his inhaler along with otc cough syrup and drops.    HPI Reports cold sx over the last few days ("less than a week"). States he has chest congestion with a dry cough. Reports compliance with steroid inhaler but has used albuterol only once.  Review of Systems     Objective:   Physical Exam  Constitutional: He appears well-developed and well-nourished. No distress.  Ears: T.M's intact without inflammation Throat: no tonsillar enlargement or exudate Neck: no cervical adenopathy Lungs: Bilateral posterior end expiratory wheezing.     Assessment:    1. Acute URI - HYDROcodone-homatropine (HYCODAN) 5-1.5 MG/5ML syrup; Take 5 mLs by mouth every 6 (six) hours as needed for up to 5 days. 5 ml 4-6 hours as needed for cough  Dispense: 100 mL; Refill: 0    Plan:    Discussed use of Mucinex D and scheduling albuterol. Further f/u for increased shortness of breath or not improving.

## 2018-02-08 ENCOUNTER — Encounter: Payer: Self-pay | Admitting: Family Medicine

## 2018-02-08 ENCOUNTER — Ambulatory Visit (INDEPENDENT_AMBULATORY_CARE_PROVIDER_SITE_OTHER): Payer: Medicare Other | Admitting: Family Medicine

## 2018-02-08 VITALS — BP 135/66 | HR 64 | Temp 97.9°F | Resp 18 | Wt 159.0 lb

## 2018-02-08 DIAGNOSIS — R05 Cough: Secondary | ICD-10-CM

## 2018-02-08 DIAGNOSIS — R059 Cough, unspecified: Secondary | ICD-10-CM

## 2018-02-08 MED ORDER — DOXYCYCLINE HYCLATE 100 MG PO TABS: 100.0000 mg | ORAL_TABLET | Freq: Two times a day (BID) | ORAL | 0 refills | Status: AC

## 2018-02-08 NOTE — Progress Notes (Signed)
Patient: Jesse Sandoval Male    DOB: [birth year]   84 y.o.   MRN: [id number] Visit Date: 02/08/2018  Today's Provider: Lelon Huh, MD   Chief Complaint  Patient presents with  . URI    x 3 weeks   Subjective:    URI   This is a recurrent problem. Episode onset: 3 weeks ago. The problem has been unchanged (Patient was seen for URI on 01/20/2018 and was prescribed Hycodan cough syrup). Associated symptoms include congestion, coughing, rhinorrhea and a sore throat. Pertinent negatives include no abdominal pain, chest pain, ear pain, nausea, plugged ear sensation, vomiting or wheezing. Treatments tried: Hycodan cough syrup and Mucinex. The treatment provided mild relief.   Cough is worse at night. Productive of clear sputum. He states he usually need antibiotic to clear same dx in the past. Is also taking montelukast daily and prn albuterol with minimal relief.     Allergies  Allergen Reactions  . Codeine   . Penicillins   . Sulfa Antibiotics      Current Outpatient Medications:  .  acetaminophen (TYLENOL) 500 MG tablet, Take 500 mg by mouth every 6 (six) hours as needed., Disp: , Rfl:  .  albuterol (PROVENTIL HFA;VENTOLIN HFA) 108 (90 Base) MCG/ACT inhaler, Inhale 2 puffs into the lungs every 6 (six) hours as needed for wheezing or shortness of breath., Disp: 1 Inhaler, Rfl: 2 .  fluticasone (FLONASE) 50 MCG/ACT nasal spray, Place 2 sprays into both nostrils daily., Disp: 16 g, Rfl: 6 .  Fluticasone Propionate, Inhal, 100 MCG/BLIST AEPB, Inhale 2 puffs into the lungs daily., Disp: 1 each, Rfl: 5 .  levothyroxine (SYNTHROID, LEVOTHROID) 50 MCG tablet, Take 1 tablet (50 mcg total) by mouth daily., Disp: 90 tablet, Rfl: 1 .  mesalamine (CANASA) 1000 MG suppository, Place 1,000 mg rectally as needed. , Disp: , Rfl:  .  montelukast (SINGULAIR) 10 MG tablet, TAKE 1 TABLET BY MOUTH ONCE DAILY, Disp: 30 tablet, Rfl: 12 .  timolol (TIMOPTIC) 0.5 % ophthalmic solution, Place 1 drop into  both eyes 2 (two) times daily., Disp: , Rfl:   Review of Systems  Constitutional: Negative for appetite change, chills and fever.  HENT: Positive for congestion, rhinorrhea and sore throat. Negative for ear pain.   Respiratory: Positive for cough. Negative for chest tightness, shortness of breath and wheezing.   Cardiovascular: Negative for chest pain and palpitations.  Gastrointestinal: Negative for abdominal pain, nausea and vomiting.    Social History   Tobacco Use  . Smoking status: Never Smoker  . Smokeless tobacco: Never Used  Substance Use Topics  . Alcohol use: No   Objective:   BP 135/66 (BP Location: Left Arm, Patient Position: Sitting, Cuff Size: Normal)   Pulse 64   Temp 97.9 F (36.6 C) (Oral)   Resp 18   Wt 159 lb (72.1 kg)   SpO2 98% Comment: room air  BMI 22.18 kg/m  Vitals:   02/08/18 1415  BP: 135/66  Pulse: 64  Resp: 18  Temp: 97.9 F (36.6 C)  TempSrc: Oral  SpO2: 98%  Weight: 159 lb (72.1 kg)     Physical Exam   General Appearance:    Alert, cooperative, no distress  Eyes:    PERRL, conjunctiva/corneas clear, EOM's intact       Lungs:     Clear to auscultation bilaterally, respirations unlabored  Heart:    Regular rate and rhythm  Neurologic:   Awake, alert, oriented  x 3. No apparent focal neurological           defect.            Assessment & Plan:     1. Cough Persistent for last 2 weeks. He feels like similar coughs in the past only resolved with antibiotic.conitnue montelukast and albuterol.  - doxycycline (VIBRA-TABS) 100 MG tablet; Take 1 tablet (100 mg total) by mouth 2 (two) times daily for 10 days.  Dispense: 20 tablet; Refill: 0  Call if symptoms change or if not rapidly improving.          Lelon Huh, MD  [facility]

## 2018-02-28 ENCOUNTER — Ambulatory Visit: Payer: Medicare Other

## 2018-03-07 ENCOUNTER — Ambulatory Visit (INDEPENDENT_AMBULATORY_CARE_PROVIDER_SITE_OTHER): Payer: Medicare Other

## 2018-03-07 DIAGNOSIS — Z23 Encounter for immunization: Secondary | ICD-10-CM | POA: Diagnosis not present

## 2018-06-26 ENCOUNTER — Other Ambulatory Visit: Payer: Self-pay | Admitting: Family Medicine

## 2018-07-13 ENCOUNTER — Other Ambulatory Visit: Payer: Self-pay

## 2018-07-13 DIAGNOSIS — J42 Unspecified chronic bronchitis: Secondary | ICD-10-CM

## 2018-07-13 MED ORDER — FLUTICASONE PROPIONATE (INHAL) 100 MCG/BLIST IN AEPB: 2.0000 | INHALATION_SPRAY | Freq: Every day | RESPIRATORY_TRACT | 5 refills | Status: DC

## 2018-07-13 NOTE — Telephone Encounter (Signed)
Patient called requesting refills. [place] Please review. Thanks!

## 2018-07-31 ENCOUNTER — Telehealth: Payer: Self-pay | Admitting: Family Medicine

## 2018-07-31 MED ORDER — LEVOTHYROXINE SODIUM 50 MCG PO TABS: 50.0000 ug | ORAL_TABLET | Freq: Every day | ORAL | 1 refills | Status: DC

## 2018-07-31 MED ORDER — MONTELUKAST SODIUM 10 MG PO TABS: 10.0000 mg | ORAL_TABLET | Freq: Every day | ORAL | 1 refills | Status: DC

## 2018-07-31 NOTE — Telephone Encounter (Signed)
pt needs refill on   Montelukast 10 mg  Levothyroxine 50 [place]  [employer]

## 2018-09-22 ENCOUNTER — Other Ambulatory Visit: Payer: Self-pay

## 2018-09-22 ENCOUNTER — Encounter: Payer: Self-pay | Admitting: Family Medicine

## 2018-09-22 ENCOUNTER — Ambulatory Visit (INDEPENDENT_AMBULATORY_CARE_PROVIDER_SITE_OTHER): Payer: Medicare Other | Admitting: Family Medicine

## 2018-09-22 VITALS — BP 138/69 | HR 55 | Temp 98.4°F | Ht 71.0 in | Wt 160.0 lb

## 2018-09-22 DIAGNOSIS — Z0000 Encounter for general adult medical examination without abnormal findings: Secondary | ICD-10-CM | POA: Diagnosis not present

## 2018-09-22 DIAGNOSIS — Z131 Encounter for screening for diabetes mellitus: Secondary | ICD-10-CM

## 2018-09-22 DIAGNOSIS — E039 Hypothyroidism, unspecified: Secondary | ICD-10-CM

## 2018-09-22 DIAGNOSIS — K2950 Unspecified chronic gastritis without bleeding: Secondary | ICD-10-CM

## 2018-09-22 DIAGNOSIS — D649 Anemia, unspecified: Secondary | ICD-10-CM

## 2018-09-22 DIAGNOSIS — I440 Atrioventricular block, first degree: Secondary | ICD-10-CM

## 2018-09-22 NOTE — Progress Notes (Deleted)
Patient: Jesse Sandoval, Male    DOB: 1931-07-09, 85 y.o.   MRN: [id number] Visit Date: 09/22/2018  Today's Provider: Lelon Huh, MD   Chief Complaint  Patient presents with  . Annual Exam   Subjective:     Complete Physical Jesse Sandoval is a 85 y.o. male. He feels fairly well. He has been having trouble with reflux for months.  He states he uses baking soda on occasion and that helps.  He reports exercising regularly. He reports he is sleeping well.  Has occasional indigestion and heartburn Rarely uses albuterol -----------------------------------------------------------   Review of Systems  Constitutional: Negative for chills, diaphoresis and fever.  HENT: Negative for congestion, ear discharge, ear pain, hearing loss, nosebleeds, sore throat and tinnitus.   Eyes: Negative for photophobia, pain, discharge and redness.  Respiratory: Negative for cough, shortness of breath, wheezing and stridor.   Cardiovascular: Negative for chest pain, palpitations and leg swelling.  Gastrointestinal: Negative for abdominal pain, blood in stool, constipation, diarrhea, nausea and vomiting.  Endocrine: Negative for polydipsia.  Genitourinary: Negative for dysuria, flank pain, frequency, hematuria and urgency.  Musculoskeletal: Negative for back pain, myalgias and neck pain.  Skin: Negative for rash.  Allergic/Immunologic: Negative for environmental allergies.  Neurological: Negative for dizziness, tremors, seizures, weakness and headaches.  Hematological: Does not bruise/bleed easily.  Psychiatric/Behavioral: Negative for hallucinations and suicidal ideas. The patient is not nervous/anxious.     Social History   Socioeconomic History  . Marital status: Married    Spouse name: Not on file  . Number of children: 0  . Years of education: Not on file  . Highest education level: Not on file  Occupational History  . Occupation: Retired  Scientific laboratory technician  . Financial resource strain: Not on  file  . Food insecurity    Worry: Not on file    Inability: Not on file  . Transportation needs    Medical: Not on file    Non-medical: Not on file  Tobacco Use  . Smoking status: Never Smoker  . Smokeless tobacco: Never Used  Substance and Sexual Activity  . Alcohol use: No  . Drug use: No  . Sexual activity: Not on file  Lifestyle  . Physical activity    Days per week: Not on file    Minutes per session: Not on file  . Stress: Not on file  Relationships  . Social Herbalist on phone: Not on file    Gets together: Not on file    Attends religious service: Not on file    Active member of club or organization: Not on file    Attends meetings of clubs or organizations: Not on file    Relationship status: Not on file  . Intimate partner violence    Fear of current or ex partner: Not on file    Emotionally abused: Not on file    Physically abused: Not on file    Forced sexual activity: Not on file  Other Topics Concern  . Not on file  Social History Narrative  . Not on file    Past Medical History:  Diagnosis Date  . Barrett's esophagus   . Colitis 06/26/2013  . Diverticulosis   . Glaucoma   . History of colon polyps   . Prostate enlargement   . Prostatitis   . Skin cancer   . Temporomandibular joint disorders 08/15/2015  . Ulcerative proctitis ([place])  Patient Active Problem List   Diagnosis Date Noted  . Heart block AV first degree 09/15/2017  . Chronic bronchitis ([place]) 09/06/2016  . Restrictive lung disease 04/21/2016  . Allergic rhinitis 08/15/2015  . Basal cell carcinoma of skin 08/15/2015  . Benign enlargement of prostate 08/15/2015  . Diverticulosis of colon 08/15/2015  . Dermatitis, eczematoid 08/15/2015  . Glaucoma 08/15/2015  . Adult hypothyroidism 08/15/2015  . History of adenomatous polyp of colon 01/14/2015  . Chest tightness 06/26/2013    Past Surgical History:  Procedure Laterality Date  . COLONOSCOPY WITH PROPOFOL N/A  01/03/2015   Procedure: COLONOSCOPY WITH PROPOFOL;  Surgeon: Manya Silvas, MD;  Location: [facility] ENDOSCOPY;  Service: Endoscopy;  Laterality: N/A;  . CYSTECTOMY     chest area on the right  . EXTERNAL EAR SURGERY    . HAND SURGERY    . HERNIA REPAIR     x3  . HERNIA REPAIR  [phone]  . PROSTATE SURGERY    . SEPTOPLASTY      His family history includes Cancer in his father; Heart disease in his mother; Heart failure in his mother; Hypertension in his mother.   Current Outpatient Medications:  .  acetaminophen (TYLENOL) 500 MG tablet, Take 500 mg by mouth every 6 (six) hours as needed., Disp: , Rfl:  .  albuterol (PROVENTIL HFA;VENTOLIN HFA) 108 (90 Base) MCG/ACT inhaler, Inhale 2 puffs into the lungs every 6 (six) hours as needed for wheezing or shortness of breath., Disp: 1 Inhaler, Rfl: 2 .  fluticasone (FLONASE) 50 MCG/ACT nasal spray, Place 2 sprays into both nostrils daily., Disp: 16 g, Rfl: 6 .  Fluticasone Propionate, Inhal, 100 MCG/BLIST AEPB, Inhale 2 puffs into the lungs daily., Disp: 1 each, Rfl: 5 .  levothyroxine (SYNTHROID) 50 MCG tablet, Take 1 tablet (50 mcg total) by mouth daily., Disp: 90 tablet, Rfl: 1 .  mesalamine (CANASA) 1000 MG suppository, Place 1,000 mg rectally as needed. , Disp: , Rfl:  .  montelukast (SINGULAIR) 10 MG tablet, Take 1 tablet (10 mg total) by mouth daily., Disp: 90 tablet, Rfl: 1 .  timolol (TIMOPTIC) 0.5 % ophthalmic solution, Place 1 drop into both eyes 2 (two) times daily., Disp: , Rfl:   Patient Care Team: Birdie Sons, MD as PCP - General (Family Medicine) Dasher, Rayvon Char, MD (Dermatology) Estill Cotta, MD as Consulting Physician (Ophthalmology)     Objective:    Vitals: BP 138/69 (BP Location: Right Arm, Patient Position: Sitting, Cuff Size: Normal)   Pulse (!) 55   Temp 98.4 F (36.9 C) (Oral)   Ht 5\' 11"  (1.803 m)   Wt 160 lb (72.6 kg)   BMI 22.32 kg/m   Physical Exam  Activities of Daily Living No  flowsheet data found.  Fall Risk Assessment Fall Risk  09/15/2017 09/03/2016 09/03/2015  Falls in the past year? No Yes No  Number falls in past yr: - 1 -  Injury with Fall? - No -  Follow up - Falls prevention discussed -     Depression Screen PHQ 2/9 Scores 09/15/2017 09/03/2016 09/03/2016 09/03/2015  PHQ - 2 Score 0 0 0 0  PHQ- 9 Score 3 0 - -    6CIT Screen 09/03/2016  What Year? 0 points  What month? 0 points  What time? 0 points  Count back from 20 0 points  Months in reverse 0 points  Repeat phrase 6 points  Total Score 6       Assessment &  Plan:    Annual Physical Reviewed patient's Family Medical History Reviewed and updated list of patient's medical providers Assessment of cognitive impairment was done Assessed patient's functional ability Established a written schedule for health screening [place] Completed and Reviewed  Exercise Activities and Dietary recommendations Goals    . Increase water intake     Recommend increasing water intake to 4-6 glasses a day.        Immunization History  Administered Date(s) Administered  . Influenza, High Dose Seasonal PF 01/28/2016, 02/01/2017, 03/07/2018  . Pneumococcal Conjugate-13 08/15/2013  . Pneumococcal Polysaccharide-23 04/06/2002  . Zoster 04/27/2013    Health Maintenance  Topic Date Due  . TETANUS/TDAP  12/01/1952  . INFLUENZA VACCINE  10/28/2018  . PNA vac Low Risk Adult  Completed     Discussed health benefits of physical activity, and encouraged him to engage in regular exercise appropriate for his age and condition.    ------------------------------------------------------------------------------------------------------------    Lelon Huh, MD  [facility]

## 2018-09-22 NOTE — Patient Instructions (Addendum)
.   Please review the attached list of medications and notify my office if there are any errors.   . Please bring all of your medications to every appointment so we can make sure that our medication list is the same as yours.    The CDC recommends two doses of Shingrix (the shingles vaccine) separated by 2 to 6 months for adults age 83 years and older. I recommend checking with your insurance plan regarding coverage for this vaccine.   . You are due for a Td (tetanus-diptheria-vaccine) which protects you from tetanus and diptheria. Please check with your insurance plan or pharmacy regarding coverage for this vaccine.

## 2018-09-22 NOTE — Progress Notes (Addendum)
Patient: Jesse Sandoval, Male    DOB: 11/07/1931, 85 y.o.   MRN: [id number] Visit Date: 09/22/2018  Today's Provider: Lelon Huh, MD   Chief Complaint  Patient presents with  . Medicare Wellness  . Follow-up   Subjective:     Annual wellness visit Jesse Sandoval is a 85 y.o. male. He feels fairly well. He reports exercising by walking most days. He reports he is sleeping fairly well.  -----------------------------------------------------------  He is also due for follow up hypothyroid, last tsh was 4.680 a year ago. Is doing well with current thyroid medications.   He continues on fluticasone inhaler for chronic bronchitis and is doing well with current does. Not needing to use rescue inhaler.   His only complaint today is frequent indigestion which he describes as a burning in the pit of his stomach usually after eating. Is relieved with OTC antacids.    Social History   Socioeconomic History  . Marital status: Married    Spouse name: Not on file  . Number of children: 0  . Years of education: Not on file  . Highest education level: Not on file  Occupational History  . Occupation: Retired  Scientific laboratory technician  . Financial resource strain: Not on file  . Food insecurity    Worry: Not on file    Inability: Not on file  . Transportation needs    Medical: Not on file    Non-medical: Not on file  Tobacco Use  . Smoking status: Never Smoker  . Smokeless tobacco: Never Used  Substance and Sexual Activity  . Alcohol use: No  . Drug use: No  . Sexual activity: Not on file  Lifestyle  . Physical activity    Days per week: Not on file    Minutes per session: Not on file  . Stress: Not on file  Relationships  . Social Herbalist on phone: Not on file    Gets together: Not on file    Attends religious service: Not on file    Active member of club or organization: Not on file    Attends meetings of clubs or organizations: Not on file    Relationship status: Not  on file  . Intimate partner violence    Fear of current or ex partner: Not on file    Emotionally abused: Not on file    Physically abused: Not on file    Forced sexual activity: Not on file  Other Topics Concern  . Not on file  Social History Narrative  . Not on file    Past Medical History:  Diagnosis Date  . Barrett's esophagus   . Colitis 06/26/2013  . Diverticulosis   . Glaucoma   . History of colon polyps   . Prostate enlargement   . Prostatitis   . Skin cancer   . Temporomandibular joint disorders 08/15/2015  . Ulcerative proctitis [facility])      Patient Active Problem List   Diagnosis Date Noted  . Heart block AV first degree 09/15/2017  . Chronic bronchitis ([place]) 09/06/2016  . Restrictive lung disease 04/21/2016  . Allergic rhinitis 08/15/2015  . Basal cell carcinoma of skin 08/15/2015  . Benign enlargement of prostate 08/15/2015  . Diverticulosis of colon 08/15/2015  . Dermatitis, eczematoid 08/15/2015  . Glaucoma 08/15/2015  . Adult hypothyroidism 08/15/2015  . History of adenomatous polyp of colon 01/14/2015  . Chest tightness 06/26/2013    Past Surgical History:  Procedure Laterality Date  . COLONOSCOPY WITH PROPOFOL N/A 01/03/2015   Procedure: COLONOSCOPY WITH PROPOFOL;  Surgeon: Manya Silvas, MD;  Location: [facility]COPY;  Service: Endoscopy;  Laterality: N/A;  . CYSTECTOMY     chest area on the right  . EXTERNAL EAR SURGERY    . HAND SURGERY    . HERNIA REPAIR     x3  . HERNIA REPAIR  [phone]  . PROSTATE SURGERY    . SEPTOPLASTY      His family history includes Cancer in his father; Heart disease in his mother; Heart failure in his mother; Hypertension in his mother.   Current Outpatient Medications:  .  acetaminophen (TYLENOL) 500 MG tablet, Take 500 mg by mouth every 6 (six) hours as needed., Disp: , Rfl:  .  albuterol (PROVENTIL HFA;VENTOLIN HFA) 108 (90 Base) MCG/ACT inhaler, Inhale 2 puffs into the lungs every 6 (six) hours as  needed for wheezing or shortness of breath., Disp: 1 Inhaler, Rfl: 2 .  fluticasone (FLONASE) 50 MCG/ACT nasal spray, Place 2 sprays into both nostrils daily., Disp: 16 g, Rfl: 6 .  Fluticasone Propionate, Inhal, 100 MCG/BLIST AEPB, Inhale 2 puffs into the lungs daily., Disp: 1 each, Rfl: 5 .  levothyroxine (SYNTHROID) 50 MCG tablet, Take 1 tablet (50 mcg total) by mouth daily., Disp: 90 tablet, Rfl: 1 .  mesalamine (CANASA) 1000 MG suppository, Place 1,000 mg rectally as needed. , Disp: , Rfl:  .  montelukast (SINGULAIR) 10 MG tablet, Take 1 tablet (10 mg total) by mouth daily., Disp: 90 tablet, Rfl: 1 .  timolol (TIMOPTIC) 0.5 % ophthalmic solution, Place 1 drop into both eyes 2 (two) times daily., Disp: , Rfl:   Patient Care Team: Birdie Sons, MD as PCP - General (Family Medicine) Dasher, Rayvon Char, MD (Dermatology) Estill Cotta, MD as Consulting Physician (Ophthalmology)    Objective:    Vitals: BP 138/69 (BP Location: Right Arm, Patient Position: Sitting, Cuff Size: Normal)   Pulse (!) 55   Temp 98.4 F (36.9 C) (Oral)   Ht 5\' 11"  (1.803 m)   Wt 160 lb (72.6 kg)   BMI 22.32 kg/m      General Appearance:    Alert, cooperative, no distress, appears stated age  Head:    Normocephalic, without obvious abnormality, atraumatic  Eyes:    PERRL, conjunctiva/corneas clear, EOM's intact, fundi    benign, both eyes       Ears:    Normal TM's and external ear canals, both ears  Nose:   Nares normal, septum midline, mucosa normal, no drainage   or sinus tenderness  Throat:   Lips, mucosa, and tongue normal; teeth and gums normal  Neck:   Supple, symmetrical, trachea midline, no adenopathy;       thyroid:  No enlargement/tenderness/nodules; no carotid   bruit or JVD  Back:     Symmetric, no curvature, ROM normal, no CVA tenderness  Lungs:     Clear to auscultation bilaterally, respirations unlabored  Chest wall:    No tenderness or deformity  Heart:    Regular rate and  rhythm, S1 and S2 normal, no murmur, rub   or gallop  Abdomen:     Soft, slight epigastric tenderness, bowel sounds active all four quadrants,    no masses, no organomegaly  Genitalia:    deferred  Rectal:    deferred  Extremities:   Extremities normal, atraumatic, no cyanosis or edema  Pulses:   2+ and symmetric  all extremities  Skin:   Skin color, texture, turgor normal, no rashes or lesions  Lymph nodes:   Cervical, supraclavicular, and axillary nodes normal  Neurologic:   CNII-XII intact. Normal strength, sensation and reflexes      throughout   Reviewed all screenings and assessments.     Assessment & Plan:     1. Annual wellness visit.  Normal exam  2. Chronic gastritis without bleeding, unspecified gastritis type Rule out- H Pylori, IGM, IGG, IGA AB No significant NSAID use  3. Heart block AV first degree asymptomatic  4. Diabetes mellitus screening  - Glucose  5. Adult hypothyroidism  - TSH  6. Anemia, unspecified type  - H Pylori, IGM, IGG, IGA AB - CBC    Lelon Huh, MD  [facility]

## 2018-09-26 LAB — H PYLORI, IGM, IGG, IGA AB
H pylori, IgM Abs: <9 units (ref 0.0–8.9)
H. pylori, IgA Abs: 28.2 units — ABNORMAL HIGH (ref 0.0–8.9)
H. pylori, IgG AbS: 0.5 Index Value (ref 0.00–0.79)

## 2018-09-26 LAB — CBC
Hematocrit: 34.7 % — ABNORMAL LOW (ref 37.5–51.0)
Hemoglobin: 11.4 g/dL — ABNORMAL LOW (ref 13.0–17.7)
MCH: 31.1 pg (ref 26.6–33.0)
MCHC: 32.9 g/dL (ref 31.5–35.7)
MCV: 95 fL (ref 79–97)
Platelets: 197 10*3/uL (ref 150–450)
RBC: 3.67 x10E6/uL — ABNORMAL LOW (ref 4.14–5.80)
RDW: 12.9 % (ref 11.6–15.4)
WBC: 5.6 10*3/uL (ref 3.4–10.8)

## 2018-09-26 LAB — GLUCOSE, RANDOM: Glucose: 114 mg/dL — ABNORMAL HIGH (ref 65–99)

## 2018-09-26 LAB — TSH: TSH: 6.76 u[IU]/mL — ABNORMAL HIGH (ref 0.450–4.500)

## 2018-09-27 ENCOUNTER — Telehealth: Payer: Self-pay

## 2018-09-27 MED ORDER — CLARITHROMYCIN ER 500 MG PO TB24: 500.0000 mg | ORAL_TABLET | Freq: Two times a day (BID) | ORAL | 0 refills | Status: DC

## 2018-09-27 MED ORDER — METRONIDAZOLE 500 MG PO TABS: 500.0000 mg | ORAL_TABLET | Freq: Two times a day (BID) | ORAL | 0 refills | Status: DC

## 2018-09-27 MED ORDER — OMEPRAZOLE 20 MG PO CPDR: 20.0000 mg | DELAYED_RELEASE_CAPSULE | Freq: Every day | ORAL | 0 refills | Status: DC

## 2018-09-27 NOTE — Telephone Encounter (Signed)
-----   Message from Birdie Sons, MD sent at 09/26/2018  7:35 AM EDT ----- H. Pylori test is positive which is a common cause of ulcers. Needs to start metronidazole 500mg  twice a day for 10 days, clarithromycin 500mg  twice a day for 10 days. Omeprazole 20mg  twice a day for 10 days (either Prilosec OTC or call in prescription, which he prefers) and Pepto Bismol 2 tablets three times a day for 10 days. ALSO, please add Amylase to labs drawn last week Need to schedule follow up o.v 1 month to for h. Pylori breath test to make sure it is cured.

## 2018-09-27 NOTE — Telephone Encounter (Signed)
Patient advised as below. Patient verbalizes understanding and is in agreement with treatment plan.  

## 2018-11-01 ENCOUNTER — Ambulatory Visit (INDEPENDENT_AMBULATORY_CARE_PROVIDER_SITE_OTHER): Payer: Medicare Other | Admitting: Family Medicine

## 2018-11-01 ENCOUNTER — Encounter: Payer: Self-pay | Admitting: Family Medicine

## 2018-11-01 ENCOUNTER — Other Ambulatory Visit: Payer: Self-pay

## 2018-11-01 VITALS — BP 134/64 | HR 56 | Temp 98.4°F | Resp 16 | Wt 157.0 lb

## 2018-11-01 DIAGNOSIS — K2970 Gastritis, unspecified, without bleeding: Secondary | ICD-10-CM

## 2018-11-01 DIAGNOSIS — B9681 Helicobacter pylori [H. pylori] as the cause of diseases classified elsewhere: Secondary | ICD-10-CM

## 2018-11-01 DIAGNOSIS — R1013 Epigastric pain: Secondary | ICD-10-CM

## 2018-11-01 NOTE — Progress Notes (Signed)
Patient: Jesse Sandoval Male    DOB: 02/20/1932   85 y.o.   MRN: [id number] Visit Date: 11/01/2018  Today's Provider: Lelon Huh, MD   Chief Complaint  Patient presents with  . Follow-up   Subjective:     HPI  Follow up for H. Pylori infection:  The patient was last seen for this 1 months ago. Changes made at last visit include treating with Metronidazole, Clarithromycin, Omeprazole and Pepto Bismol.  He reports good compliance with treatment. He feels that condition is Improved. He is not having side effects.   ------------------------------------------------------------------------------------  Allergies  Allergen Reactions  . Codeine   . Penicillins   . Sulfa Antibiotics      Current Outpatient Medications:  .  acetaminophen (TYLENOL) 500 MG tablet, Take 500 mg by mouth every 6 (six) hours as needed., Disp: , Rfl:  .  albuterol (PROVENTIL HFA;VENTOLIN HFA) 108 (90 Base) MCG/ACT inhaler, Inhale 2 puffs into the lungs every 6 (six) hours as needed for wheezing or shortness of breath., Disp: 1 Inhaler, Rfl: 2 .  fluticasone (FLONASE) 50 MCG/ACT nasal spray, Place 2 sprays into both nostrils daily., Disp: 16 g, Rfl: 6 .  Fluticasone Propionate, Inhal, 100 MCG/BLIST AEPB, Inhale 2 puffs into the lungs daily., Disp: 1 each, Rfl: 5 .  levothyroxine (SYNTHROID) 50 MCG tablet, Take 1 tablet (50 mcg total) by mouth daily., Disp: 90 tablet, Rfl: 1 .  mesalamine (CANASA) 1000 MG suppository, Place 1,000 mg rectally as needed. , Disp: , Rfl:  .  montelukast (SINGULAIR) 10 MG tablet, Take 1 tablet (10 mg total) by mouth daily., Disp: 90 tablet, Rfl: 1 .  timolol (TIMOPTIC) 0.5 % ophthalmic solution, Place 1 drop into both eyes 2 (two) times daily., Disp: , Rfl:  .  omeprazole (PRILOSEC) 20 MG capsule, Take 1 capsule (20 mg total) by mouth daily. (Patient not taking: Reported on 11/01/2018), Disp: 20 capsule, Rfl: 0  Review of Systems  Constitutional: Negative for appetite  change, chills and fever.  Respiratory: Negative for chest tightness, shortness of breath and wheezing.   Cardiovascular: Negative for chest pain and palpitations.  Gastrointestinal: Negative for abdominal pain, nausea and vomiting.    Social History   Tobacco Use  . Smoking status: Never Smoker  . Smokeless tobacco: Never Used  Substance Use Topics  . Alcohol use: No      Objective:   BP 134/64 (BP Location: Left Arm, Patient Position: Sitting, Cuff Size: Normal)   Pulse (!) 56   Temp 98.4 F (36.9 C) (Oral)   Resp 16   Wt 157 lb (71.2 kg)   SpO2 98% Comment: room air  BMI 21.90 kg/m  Vitals:   11/01/18 1100  BP: 134/64  Pulse: (!) 56  Resp: 16  Temp: 98.4 F (36.9 C)  TempSrc: Oral  SpO2: 98%  Weight: 157 lb (71.2 kg)     Physical Exam  General appearance: alert, well developed, well nourished, cooperative and in no distress Head: Normocephalic, without obvious abnormality, atraumatic Respiratory: Respirations even and unlabored, normal respiratory rate Extremities: No gross deformities Skin: Skin color, texture, turgor normal. No rashes seen  Psych: Appropriate mood and affect. Neurologic: Mental status: Alert, oriented to person, place, and time, thought content appropriate.       Assessment & Plan    1. Epigastric abdominal pain Much improved but not completely resolved with treatment for h. Pylori. He may continue OTC Pepcid while awaiting results of breath  test.  - H. pylori breath test  2. Helicobacter pylori gastritis Completed triple therapy. TOC today.   The entirety of the information documented in the History of Present Illness, Review of Systems and Physical Exam were personally obtained by me. Portions of this information were initially documented by Meyer Cory, CMA and reviewed by me for thoroughness and accuracy.      Lelon Huh, MD  [facility]

## 2018-11-01 NOTE — Patient Instructions (Signed)
.   Please review the attached list of medications and notify my office if there are any errors.   . Please bring all of your medications to every appointment so we can make sure that our medication list is the same as yours.   . We will have flu vaccines available after Labor Day. Please go to your pharmacy or call the office in early September to schedule you flu shot.   

## 2018-11-02 LAB — H. PYLORI BREATH TEST: H pylori Breath Test: NEGATIVE

## 2018-11-03 ENCOUNTER — Telehealth: Payer: Self-pay

## 2018-11-03 NOTE — Telephone Encounter (Signed)
-----   Message from Birdie Sons, MD sent at 11/03/2018  8:58 AM EDT ----- H. Pylori test is negative. No more antibiotic needed. Can take OTC Pepcid prn.

## 2018-11-03 NOTE — Telephone Encounter (Signed)
Pt advised.   Thanks,   -Lilyona Richner  

## 2018-12-06 DIAGNOSIS — D225 Melanocytic nevi of trunk: Secondary | ICD-10-CM | POA: Diagnosis not present

## 2018-12-06 DIAGNOSIS — Z872 Personal history of diseases of the skin and subcutaneous tissue: Secondary | ICD-10-CM | POA: Diagnosis not present

## 2018-12-06 DIAGNOSIS — Z08 Encounter for follow-up examination after completed treatment for malignant neoplasm: Secondary | ICD-10-CM | POA: Diagnosis not present

## 2018-12-06 DIAGNOSIS — Z85828 Personal history of other malignant neoplasm of skin: Secondary | ICD-10-CM | POA: Diagnosis not present

## 2018-12-06 DIAGNOSIS — L570 Actinic keratosis: Secondary | ICD-10-CM | POA: Diagnosis not present

## 2018-12-06 DIAGNOSIS — D2262 Melanocytic nevi of left upper limb, including shoulder: Secondary | ICD-10-CM | POA: Diagnosis not present

## 2018-12-06 DIAGNOSIS — D2261 Melanocytic nevi of right upper limb, including shoulder: Secondary | ICD-10-CM | POA: Diagnosis not present

## 2019-01-22 DIAGNOSIS — H40053 Ocular hypertension, bilateral: Secondary | ICD-10-CM | POA: Diagnosis not present

## 2019-02-01 ENCOUNTER — Other Ambulatory Visit: Payer: Self-pay | Admitting: Family Medicine

## 2019-02-01 MED ORDER — MONTELUKAST SODIUM 10 MG PO TABS: 10.0000 mg | ORAL_TABLET | Freq: Every day | ORAL | 1 refills | Status: DC

## 2019-02-01 NOTE — Telephone Encounter (Signed)
Pt contacted office for refill request on the following medications:  montelukast (SINGULAIR) 10 MG tablet  90 day supply [facility] [address] Last Rx: 07/31/2018 LOV: 11/01/2018 Please advise. Thanks TNP

## 2019-02-06 DIAGNOSIS — Z23 Encounter for immunization: Secondary | ICD-10-CM | POA: Diagnosis not present

## 2019-05-14 ENCOUNTER — Other Ambulatory Visit: Payer: Self-pay

## 2019-05-14 ENCOUNTER — Emergency Department
Admission: EM | Admit: 2019-05-14 | Discharge: 2019-05-14 | Disposition: A | Discharge status: Home / Self Care | Payer: Medicare Other | Attending: Emergency Medicine | Admitting: Emergency Medicine

## 2019-05-14 ENCOUNTER — Emergency Department: Payer: Medicare Other

## 2019-05-14 ENCOUNTER — Encounter: Payer: Self-pay | Admitting: Emergency Medicine

## 2019-05-14 DIAGNOSIS — Z20822 Contact with and (suspected) exposure to covid-19: Secondary | ICD-10-CM | POA: Diagnosis not present

## 2019-05-14 DIAGNOSIS — Z79899 Other long term (current) drug therapy: Secondary | ICD-10-CM | POA: Diagnosis not present

## 2019-05-14 DIAGNOSIS — J45909 Unspecified asthma, uncomplicated: Secondary | ICD-10-CM | POA: Diagnosis not present

## 2019-05-14 DIAGNOSIS — E039 Hypothyroidism, unspecified: Secondary | ICD-10-CM | POA: Insufficient documentation

## 2019-05-14 DIAGNOSIS — R0602 Shortness of breath: Secondary | ICD-10-CM | POA: Diagnosis not present

## 2019-05-14 LAB — POC SARS CORONAVIRUS 2 AG: SARS Coronavirus 2 Ag: NEGATIVE

## 2019-05-14 LAB — CBC
HCT: 41.3 % (ref 39.0–52.0)
Hemoglobin: 13.3 g/dL (ref 13.0–17.0)
MCH: 30.9 pg (ref 26.0–34.0)
MCHC: 32.2 g/dL (ref 30.0–36.0)
MCV: 95.8 fL (ref 80.0–100.0)
Platelets: 241 10*3/uL (ref 150–400)
RBC: 4.31 MIL/uL (ref 4.22–5.81)
RDW: 13.1 % (ref 11.5–15.5)
WBC: 7.7 10*3/uL (ref 4.0–10.5)
nRBC: 0 % (ref 0.0–0.2)

## 2019-05-14 LAB — BASIC METABOLIC PANEL
Anion gap: 7 (ref 5–15)
BUN: 22 mg/dL (ref 8–23)
CO2: 26 mmol/L (ref 22–32)
Calcium: 8.7 mg/dL — ABNORMAL LOW (ref 8.9–10.3)
Chloride: 105 mmol/L (ref 98–111)
Creatinine, Ser: 1.12 mg/dL (ref 0.61–1.24)
GFR calc Af Amer: >60 mL/min (ref >60)
GFR calc non Af Amer: 60 mL/min — ABNORMAL LOW (ref >60)
Glucose, Bld: 112 mg/dL — ABNORMAL HIGH (ref 70–99)
Potassium: 4.2 mmol/L (ref 3.5–5.1)
Sodium: 138 mmol/L (ref 135–145)

## 2019-05-14 LAB — BRAIN NATRIURETIC PEPTIDE: B Natriuretic Peptide: 193 pg/mL — ABNORMAL HIGH (ref 0.0–100.0)

## 2019-05-14 LAB — TROPONIN I (HIGH SENSITIVITY): Troponin I (High Sensitivity): 8 ng/L (ref <18)

## 2019-05-14 MED ORDER — PREDNISONE 20 MG PO TABS
40.0000 mg | ORAL_TABLET | Freq: Once | ORAL | Status: AC
  Administered 2019-05-14: 40 mg via ORAL
  Filled 2019-05-14: qty 2

## 2019-05-14 MED ORDER — ALBUTEROL SULFATE HFA 108 (90 BASE) MCG/ACT IN AERS: 2.0000 | INHALATION_SPRAY | Freq: Four times a day (QID) | RESPIRATORY_TRACT | 1 refills | Status: DC | PRN

## 2019-05-14 MED ORDER — METHYLPREDNISOLONE SODIUM SUCC 125 MG IJ SOLR
INTRAMUSCULAR | Status: AC
  Administered 2019-05-14: 12:00:00 125 mg via INTRAVENOUS
  Filled 2019-05-14: qty 2

## 2019-05-14 MED ORDER — IPRATROPIUM-ALBUTEROL 0.5-2.5 (3) MG/3ML IN SOLN: 3.0000 mL | Freq: Once | RESPIRATORY_TRACT | Status: AC

## 2019-05-14 MED ORDER — IPRATROPIUM-ALBUTEROL 0.5-2.5 (3) MG/3ML IN SOLN
3.0000 mL | Freq: Once | RESPIRATORY_TRACT | Status: AC
  Administered 2019-05-14: 3 mL via RESPIRATORY_TRACT

## 2019-05-14 MED ORDER — IPRATROPIUM-ALBUTEROL 0.5-2.5 (3) MG/3ML IN SOLN
RESPIRATORY_TRACT | Status: AC
  Administered 2019-05-14: 3 mL via RESPIRATORY_TRACT
  Filled 2019-05-14: qty 9

## 2019-05-14 MED ORDER — METHYLPREDNISOLONE SODIUM SUCC 125 MG IJ SOLR: 125.0000 mg | INTRAMUSCULAR | Status: AC

## 2019-05-14 MED ORDER — ALBUTEROL SULFATE (2.5 MG/3ML) 0.083% IN NEBU
2.5000 mg | INHALATION_SOLUTION | Freq: Once | RESPIRATORY_TRACT | Status: AC
  Administered 2019-05-14: 14:00:00 2.5 mg via RESPIRATORY_TRACT
  Filled 2019-05-14: qty 3

## 2019-05-14 MED ORDER — MAGNESIUM SULFATE IN D5W 1-5 GM/100ML-% IV SOLN
1.0000 g | Freq: Once | INTRAVENOUS | Status: AC
  Administered 2019-05-14: 1 g via INTRAVENOUS
  Filled 2019-05-14: qty 100

## 2019-05-14 MED ORDER — PREDNISONE 50 MG PO TABS: ORAL_TABLET | ORAL | 0 refills | Status: DC

## 2019-05-14 NOTE — ED Provider Notes (Signed)
[facility] [place] Emergency Department Provider Note ____________________________________________   First MD Initiated Contact with Patient 05/14/19 1136     (approximate)  I have reviewed the triage vital signs and the nursing notes.   HISTORY  Chief Complaint Shortness of Breath  EM caveat somewhat mentation due to moderate respiratory distress  HPI Jesse Sandoval is a 86 y.o. male here for trouble breathing  Patient reports he was going to get some groceries at about 11 AM he suddenly started to feel short of breath with wheezing and coughing.  Denies exposure to coronavirus or having had a fever.  Reports a nonproductive dry cough feels like he is wheezing.  Has used albuterol in the past, but did not feel the need to use it this morning as he felt like he was okay until somewhat abruptly at about 11 he started to wheeze  This is worsened.  Reports shortness of breath, reports he is never felt this short of breath before.  He is having no pain.  No chest pain.  Denies history of heart failure.  No leg swelling or weight gain.  Reports it feels like asthma, but seems very severe.   Past Medical History:  Diagnosis Date  . Barrett's esophagus   . Colitis 06/26/2013  . Diverticulosis   . Glaucoma   . History of colon polyps   . Prostate enlargement   . Prostatitis   . Skin cancer   . Temporomandibular joint disorders 08/15/2015  . Ulcerative proctitis [facility])     Patient Active Problem List   Diagnosis Date Noted  . Heart block AV first degree 09/15/2017  . Chronic bronchitis ([place]) 09/06/2016  . Restrictive lung disease 04/21/2016  . Allergic rhinitis 08/15/2015  . Basal cell carcinoma of skin 08/15/2015  . Benign enlargement of prostate 08/15/2015  . Diverticulosis of colon 08/15/2015  . Dermatitis, eczematoid 08/15/2015  . Glaucoma 08/15/2015  . Adult hypothyroidism 08/15/2015  . History of adenomatous polyp of colon 01/14/2015  . Chest tightness  06/26/2013    Past Surgical History:  Procedure Laterality Date  . COLONOSCOPY WITH PROPOFOL N/A 01/03/2015   Procedure: COLONOSCOPY WITH PROPOFOL;  Surgeon: Manya Silvas, MD;  Location: [facility] ENDOSCOPY;  Service: Endoscopy;  Laterality: N/A;  . CYSTECTOMY     chest area on the right  . EXTERNAL EAR SURGERY    . HAND SURGERY    . HERNIA REPAIR     x3  . HERNIA REPAIR  [phone]  . PROSTATE SURGERY    . SEPTOPLASTY      Prior to Admission medications   Medication Sig Start Date End Date Taking? Authorizing Provider  acetaminophen (TYLENOL) 500 MG tablet Take 500 mg by mouth every 6 (six) hours as needed.    [provider]  albuterol (PROVENTIL HFA;VENTOLIN HFA) 108 (90 Base) MCG/ACT inhaler Inhale 2 puffs into the lungs every 6 (six) hours as needed for wheezing or shortness of breath. 07/21/17   Bacigalupo, Dionne Bucy, MD  albuterol (VENTOLIN HFA) 108 (90 Base) MCG/ACT inhaler Inhale 2 puffs into the lungs every 6 (six) hours as needed for wheezing or shortness of breath. 05/14/19   Delman Kitten, MD  famotidine (PEPCID) 20 MG tablet Take 20 mg by mouth 2 (two) times daily as needed for heartburn or indigestion.    [provider]  fluticasone (FLONASE) 50 MCG/ACT nasal spray Place 2 sprays into both nostrils daily. 07/21/17   Virginia Crews, MD  Fluticasone Propionate, Inhal,  100 MCG/BLIST AEPB Inhale 2 puffs into the lungs daily. 07/13/18   Birdie Sons, MD  levothyroxine (SYNTHROID) 50 MCG tablet Take 1 tablet (50 mcg total) by mouth daily. 07/31/18   Birdie Sons, MD  mesalamine (CANASA) 1000 MG suppository Place 1,000 mg rectally as needed.     [provider]  montelukast (SINGULAIR) 10 MG tablet Take 1 tablet (10 mg total) by mouth daily. 02/01/19   Birdie Sons, MD  predniSONE (DELTASONE) 50 MG tablet 1 tab by mouth daily starting 05/15/2019 05/14/19   Delman Kitten, MD  timolol (TIMOPTIC) 0.5 % ophthalmic solution Place 1 drop into both  eyes 2 (two) times daily.    [provider]    Allergies Codeine, Penicillins, and Sulfa antibiotics  Family History  Problem Relation Age of Onset  . Heart disease Mother   . Heart failure Mother   . Hypertension Mother   . Cancer Father        pancreatic    Social History Social History   Tobacco Use  . Smoking status: Never Smoker  . Smokeless tobacco: Never Used  Substance Use Topics  . Alcohol use: No  . Drug use: No  Reports non-smoker, no known history of COPD.  Been told he has asthma, uses allergy spray every day.  Also has albuterol at home but rarely has to use it  Review of Systems Constitutional: No fever/chills Eyes:  ENT: No sore throat. Cardiovascular: Denies chest pain. Respiratory: See HPI Gastrointestinal: No abdominal pain.   Genitourinary:  Musculoskeletal: Negative forpain. Skin:  Neurological: Negative for headaches, areas of focal weakness or numbness.    ____________________________________________   PHYSICAL EXAM:  VITAL SIGNS: ED Triage Vitals  Enc Vitals Group     BP 05/14/19 1125 (!) 115/101     Pulse Rate 05/14/19 1125 81     Resp 05/14/19 1125 (!) 24     Temp 05/14/19 1125 98 F (36.7 C)     Temp Source 05/14/19 1125 Oral     SpO2 05/14/19 1125 (!) 89 %     Weight 05/14/19 1127 160 lb (72.6 kg)     Height 05/14/19 1127 5\' 11"  (1.803 m)     Head Circumference --      Peak Flow --      Pain Score 05/14/19 1127 0     Pain Loc --      Pain Edu? --      Excl. in [place]? --     Constitutional: Alert and oriented.  Moderately ill-appearing, dyspneic.  Sitting up, not tripoding, but audible wheezing across the room.  Appears to have moderate respiratory distress. Eyes: Conjunctivae are normal. Head: Atraumatic. Nose: No congestion/rhinnorhea. Mouth/Throat: Mucous membranes are moist. Neck: No stridor.  No JVD. Cardiovascular: Normal rate, regular rhythm. Grossly normal heart sounds.  Good peripheral  circulation. Respiratory: Tachypneic.  Moderate distress.  Use of accessory muscles.  Speaks in 2-3 word phrases.  Diffuse coarse somewhat wheezing lung sounds throughout without focal rails noted.  No crackles. Gastrointestinal: Soft and nontender. No distention. Musculoskeletal: No lower extremity tenderness nor edema.  Does not appear to have evidence of volume overload. Neurologic:  Normal speech and language. No gross focal neurologic deficits are appreciated.  Skin:  Skin is warm, dry and intact. No rash noted. Psychiatric: Mood and affect are normal except anxious. Speech and behavior are normal.  ____________________________________________   LABS (all labs ordered are listed, but only abnormal results are displayed)  Labs Reviewed  BASIC METABOLIC PANEL - Abnormal; Notable for the following components:      Result Value   Glucose, Bld 112 (*)    Calcium 8.7 (*)    GFR calc non Af Amer 60 (*)    All other components within normal limits  BRAIN NATRIURETIC PEPTIDE - Abnormal; Notable for the following components:   B Natriuretic Peptide 193.0 (*)    All other components within normal limits  SARS CORONAVIRUS 2 (TAT 6-24 HRS)  CBC  POC SARS CORONAVIRUS 2 AG -  ED  POC SARS CORONAVIRUS 2 AG  TROPONIN I (HIGH SENSITIVITY)   ____________________________________________  EKG  Reviewed inter by me at 1130 Heart rate 89 QRS 149 QTc 480 Normal sinus rhythm, first-degree AV block.  Right bundle branch block morphology.  Some baseline artifact.  No evidence of acute ischemia.  Able to visualize her previous EKG from 2019, but notation of the system of first-degree AV block and being borderline EKG is noted from the previous ____________________________________________  RADIOLOGY  DG Chest Portable 1 View  Result Date: 05/14/2019 CLINICAL DATA:  Shortness of breath and tachypnea. History of asthma. EXAM: PORTABLE CHEST 1 VIEW COMPARISON:  04/21/2016 FINDINGS: The cardiac  silhouette, mediastinal and hilar contours are within normal limits and stable. The lungs are clear. No infiltrates, edema or effusions. No pulmonary lesions. The bony thorax is intact. IMPRESSION: No acute cardiopulmonary findings. Electronically Signed   By: Marijo Sanes M.D.   On: 05/14/2019 12:18    Imaging studies reviewed negative for acute ____________________________________________   PROCEDURES  Procedure(s) performed: None  Procedures  Critical Care performed: No  ____________________________________________   INITIAL IMPRESSION / ASSESSMENT AND PLAN / ED COURSE  Pertinent labs & imaging results that were available during my care of the patient were reviewed by me and considered in my medical decision making (see chart for details).     Dyspnea.  Somewhat abrupt in onset.  Associated coarse lung sounds and wheezing.  Initial exam seems most consistent with some type of reactive restrictive airway disease, and he lacks obvious infectious symptoms except for cough.  We will test with rapid Covid though.  Chest x-ray.  Lab work BNP, no known history of CHF.  Does not appear acutely volume overloaded.  Moderate increased work of breathing, monitoring patient closely.  Clinical Course as of May 13 1516  Mon May 14, 2019  1314 Patient condition much improved.  He reports he feels much better and he looks much better.  His lungs are now clear his respirations normal and he speaking in full clear sentences on room air with normal oxygen saturation.  Patient condition improved, responded very well to treatment.  Will provide additional albuterol treatment and observe him in the ER for period of time for recurrence of symptoms, but I anticipate he will likely be able to be discharged home if he continues to do well.  Currently asymptomatic.   [MQ]    Clinical Course User Index [MQ] Delman Kitten, MD    ----------------------------------------- 3:19 PM on  05/14/2019 ----------------------------------------- Patient asymptomatic.  Feels completely improved.  He is resting comfortably in no distress.  He will plan to pick up prescription for albuterol which she has at home but reports his inhaler is probably a couple years old would like a new one, as well as prednisone.  Discussed side effects and risk benefit of that medication as well with him.  Patient comfortable plan, appears much improved, ambulating now  without any difficulty and normal oxygen saturations.  Return precautions and treatment recommendations and follow-up discussed with the patient who is agreeable with the plan.   ____________________________________________   FINAL CLINICAL IMPRESSION(S) / ED DIAGNOSES  Final diagnoses:  Reactive airway disease without complication, unspecified asthma severity, unspecified whether persistent        Note:  This document was prepared using Dragon voice recognition software and may include unintentional dictation errors       Delman Kitten, MD 05/14/19 1521

## 2019-05-14 NOTE — ED Notes (Signed)
Pt signed dischrage paperwork and sent to chart.

## 2019-05-14 NOTE — ED Notes (Signed)
Pt ambulated per Dr. Jacqualine Code, pt tolerated ambulation well and sats stayed at 98-100%. Pt states feeling slightly weak but that his breathing was okay.

## 2019-05-14 NOTE — ED Triage Notes (Signed)
Patient presents to the ED with shortness of breath, tachypnea and audible wheezing.  Patient reports history of asthma and reports using inhaler at home with little relief.  Patient appears very uncomfortable in triage.

## 2019-05-15 LAB — SARS CORONAVIRUS 2 (TAT 6-24 HRS): SARS Coronavirus 2: NEGATIVE

## 2019-05-21 ENCOUNTER — Telehealth: Payer: Self-pay

## 2019-05-21 NOTE — Telephone Encounter (Signed)
Copied from [place] [id number]. Topic: Appointment Scheduling - Scheduling Inquiry for Clinic >> May 21, 2019  9:47 AM Mathis Bud wrote: Reason for CRM: Patient states he was discharged from [facility] 2/15 for an asthma attack.  PCP does not have any HFU until march 8th. Call back [phone]

## 2019-05-21 NOTE — Telephone Encounter (Signed)
Patient scheduled for 05/23/2019 at 10:40.

## 2019-05-21 NOTE — Telephone Encounter (Signed)
He can have a same day appointment. Its an ER follow up, not a hospital follow up, so 20 minute appt is fine.   Thanks

## 2019-05-23 ENCOUNTER — Encounter: Payer: Self-pay | Admitting: Family Medicine

## 2019-05-23 ENCOUNTER — Ambulatory Visit (INDEPENDENT_AMBULATORY_CARE_PROVIDER_SITE_OTHER): Payer: Medicare Other | Admitting: Family Medicine

## 2019-05-23 ENCOUNTER — Other Ambulatory Visit: Payer: Self-pay

## 2019-05-23 VITALS — BP 144/60 | HR 53 | Temp 97.1°F | Wt 167.0 lb

## 2019-05-23 DIAGNOSIS — R7989 Other specified abnormal findings of blood chemistry: Secondary | ICD-10-CM

## 2019-05-23 DIAGNOSIS — R001 Bradycardia, unspecified: Secondary | ICD-10-CM | POA: Diagnosis not present

## 2019-05-23 DIAGNOSIS — R0600 Dyspnea, unspecified: Secondary | ICD-10-CM

## 2019-05-23 DIAGNOSIS — I440 Atrioventricular block, first degree: Secondary | ICD-10-CM

## 2019-05-23 DIAGNOSIS — R0601 Orthopnea: Secondary | ICD-10-CM | POA: Diagnosis not present

## 2019-05-23 DIAGNOSIS — R0609 Other forms of dyspnea: Secondary | ICD-10-CM

## 2019-05-23 NOTE — Progress Notes (Signed)
Patient: Jesse Sandoval Male    DOB: 06-Feb-1932   86 y.o.   MRN: [id number] Visit Date: 05/23/2019  Today's Provider: Lelon Huh, MD   Chief Complaint  Patient presents with  . ER Follow Up   Subjective:     HPI  Follow up ER visit  Patient was seen in ER for SOB, cough, and wheezing on 05/14/2019. He was treated for Reactive airway disease. Had negative chest xray and Covid test was negative. BNP was elevated. He was given several nebulizer treatments including albuterol and Duonebs, as well as injection of solumedrol and magnesium which he states helped.  Was preceded by a few weeks of congestion and wheezing.  Treatment for this included started Prednisone 50 mg and Albuterol inhaler. He reports good compliance with treatment. He reports this condition is slightly improved. Patient C/O SOB and lightheadedness. He states he had bad headaches when he was on prednisone, but breathing has not worsened since finishing it. He does fell more short of breath lying down than when sitting up.  ------------------------------------------------------------------------------------   Allergies  Allergen Reactions  . Codeine   . Penicillins   . Sulfa Antibiotics      Current Outpatient Medications:  .  acetaminophen (TYLENOL) 500 MG tablet, Take 500 mg by mouth every 6 (six) hours as needed., Disp: , Rfl:  .  albuterol (PROVENTIL HFA;VENTOLIN HFA) 108 (90 Base) MCG/ACT inhaler, Inhale 2 puffs into the lungs every 6 (six) hours as needed for wheezing or shortness of breath., Disp: 1 Inhaler, Rfl: 2 .  albuterol (VENTOLIN HFA) 108 (90 Base) MCG/ACT inhaler, Inhale 2 puffs into the lungs every 6 (six) hours as needed for wheezing or shortness of breath., Disp: 6.7 g, Rfl: 1 .  famotidine (PEPCID) 20 MG tablet, Take 20 mg by mouth 2 (two) times daily as needed for heartburn or indigestion., Disp: , Rfl:  .  fluticasone (FLONASE) 50 MCG/ACT nasal spray, Place 2 sprays into both nostrils  daily., Disp: 16 g, Rfl: 6 .  Fluticasone Propionate, Inhal, 100 MCG/BLIST AEPB, Inhale 2 puffs into the lungs daily., Disp: 1 each, Rfl: 5 .  levothyroxine (SYNTHROID) 50 MCG tablet, Take 1 tablet (50 mcg total) by mouth daily., Disp: 90 tablet, Rfl: 1 .  mesalamine (CANASA) 1000 MG suppository, Place 1,000 mg rectally as needed. , Disp: , Rfl:  .  montelukast (SINGULAIR) 10 MG tablet, Take 1 tablet (10 mg total) by mouth daily., Disp: 90 tablet, Rfl: 1 .  predniSONE (DELTASONE) 50 MG tablet, 1 tab by mouth daily starting 05/15/2019,(NOT TAKING, FINISHED ON 05/18/2019) .  timolol (TIMOPTIC) 0.5 % ophthalmic solution, Place 1 drop into both eyes 2 (two) times daily., Disp: , Rfl:   Review of Systems  Constitutional: Negative.   Respiratory: Positive for shortness of breath.   Cardiovascular: Negative.   Musculoskeletal: Negative.   Neurological: Positive for light-headedness.    Social History   Tobacco Use  . Smoking status: Never Smoker  . Smokeless tobacco: Never Used  Substance Use Topics  . Alcohol use: No      Objective:   BP (!) 174/72 (BP Location: Right Arm, Patient Position: Sitting, Cuff Size: Normal)   Pulse (!) 53   Temp (!) 97.1 F (36.2 C) (Temporal)   Wt 167 lb (75.8 kg)   SpO2 98%   BMI 23.29 kg/m  Vitals:   05/23/19 1027  BP: (!) 174/72  Pulse: (!) 53  Temp: (!) 97.1 F (36.2  C)  TempSrc: Temporal  SpO2: 98%  Weight: 167 lb (75.8 kg)  Body mass index is 23.29 kg/m.   Physical Exam   General: Appearance:    Well developed, well nourished male in no acute distress  Eyes:    PERRL, conjunctiva/corneas clear, EOM's intact       Lungs:     Clear to auscultation bilaterally, respirations unlabored  Heart:    Bradycardic. Normal rhythm. No murmurs, rubs, or gallops.   MS:   All extremities are intact.   Neurologic:   Awake, alert, oriented x 3. No apparent focal neurological           defect.        No results found for any visits on 05/23/19.       Assessment & Plan    1. Elevated brain natriuretic peptide (BNP) level   2. Orthopnea Much improved since presenting to ER last week. Continue albuterol for now.   3. Dyspnea on exertion Unclear if this was cardiac or pulmonary in nature, but patient states he noticed most improvement after given injections of solumetrol and magnesium in ER.  - Brain natriuretic peptide Consider echocardiogram is still elevated.   4. Bradycardia   5. Heart block AV first degree   The entirety of the information documented in the History of Present Illness, Review of Systems and Physical Exam were personally obtained by me. Portions of this information were initially documented by Idelle Jo, CMA and reviewed by me for thoroughness and accuracy.   Lelon Huh, MD  [facility]

## 2019-05-23 NOTE — Addendum Note (Signed)
Addended by: Lelon Huh E on: 05/23/2019 11:04 AM   Modules accepted: Level of Service

## 2019-05-24 LAB — BRAIN NATRIURETIC PEPTIDE: BNP: 69.9 pg/mL (ref 0.0–100.0)

## 2019-06-02 ENCOUNTER — Ambulatory Visit: Payer: Medicare Other | Attending: Internal Medicine

## 2019-06-02 ENCOUNTER — Other Ambulatory Visit: Payer: Self-pay

## 2019-06-02 DIAGNOSIS — Z23 Encounter for immunization: Secondary | ICD-10-CM

## 2019-06-02 NOTE — Progress Notes (Signed)
   Covid-19 Vaccination Clinic  Name:  Jesse Sandoval    MRN: [id number] DOB: 08/23/1931  06/02/2019  [name] was observed post Covid-19 immunization for 15 minutes without incident. He was provided with Vaccine Information Sheet and instruction to access the V-Safe system.   [name] was instructed to call 911 with any severe reactions post vaccine: Marland Kitchen Difficulty breathing  . Swelling of face and throat  . A fast heartbeat  . A bad rash all over body  . Dizziness and weakness   Immunizations Administered    Name Date Dose VIS Date Route   Pfizer COVID-19 Vaccine 06/02/2019 10:42 AM 0.3 mL 03/09/2019 Intramuscular   Manufacturer: [place]   Lot: [id number]   [place]: [id number]

## 2019-06-27 ENCOUNTER — Ambulatory Visit: Payer: Medicare Other | Attending: Internal Medicine

## 2019-06-27 DIAGNOSIS — Z23 Encounter for immunization: Secondary | ICD-10-CM

## 2019-06-27 NOTE — Progress Notes (Signed)
   Covid-19 Vaccination Clinic  Name:  Jesse Sandoval    MRN: [id number] DOB: 1932-01-04  06/27/2019  Jesse Sandoval was observed post Covid-19 immunization for 15 minutes without incident. He was provided with Vaccine Information Sheet and instruction to access the V-Safe system.   Jesse Sandoval was instructed to call 911 with any severe reactions post vaccine: Marland Kitchen Difficulty breathing  . Swelling of face and throat  . A fast heartbeat  . A bad rash all over body  . Dizziness and weakness   Immunizations Administered    Name Date Dose VIS Date Route   Pfizer COVID-19 Vaccine 06/27/2019 10:52 AM 0.3 mL 03/09/2019 Intramuscular   Manufacturer: [place]   Lot: [id number]   [place]: [id number]

## 2019-07-09 ENCOUNTER — Other Ambulatory Visit: Payer: Self-pay | Admitting: Family Medicine

## 2019-07-09 DIAGNOSIS — J42 Unspecified chronic bronchitis: Secondary | ICD-10-CM

## 2019-07-09 MED ORDER — FLUTICASONE PROPIONATE (INHAL) 100 MCG/BLIST IN AEPB: 2.0000 | INHALATION_SPRAY | Freq: Every day | RESPIRATORY_TRACT | 5 refills | Status: DC

## 2019-07-09 NOTE — Telephone Encounter (Signed)
Medication Refill - Medication:  Fluticasone Propionate, Inhal, 100 MCG/BLIST AEPB    Has the patient contacted their pharmacy?  Yes advised to call office.   Preferred Pharmacy (with phone number or street name):  [id number] [name], [place] Phone:  [phone]  Fax:  [phone]       Agent: Please be advised that RX refills may take up to 3 business days. We ask that you follow-up with your pharmacy.

## 2019-07-23 DIAGNOSIS — H40053 Ocular hypertension, bilateral: Secondary | ICD-10-CM | POA: Diagnosis not present

## 2019-07-25 ENCOUNTER — Other Ambulatory Visit: Payer: Self-pay | Admitting: Family Medicine

## 2019-07-25 NOTE — Telephone Encounter (Signed)
Requested Prescriptions  Pending Prescriptions Disp Refills  . montelukast (SINGULAIR) 10 MG tablet [Pharmacy Med Name: MONTELUKAST SOD 10 MG TABLET] 90 tablet 1    Sig: TAKE 1 BY MOUTH EVERY DAY.     Pulmonology:  Leukotriene Inhibitors Passed - 07/25/2019  2:22 AM      Passed - Valid encounter within last 12 months    Recent Outpatient Visits          2 months ago Elevated brain natriuretic peptide (BNP) level   [place], [place]   8 months ago Epigastric abdominal pain   [facility] Birdie Sons, MD   10 months ago Annual physical exam   [facility] Birdie Sons, MD   1 year ago Cough   [facility] Birdie Sons, MD   1 year ago Acute URI   [place], [place]

## 2019-07-30 DIAGNOSIS — H40053 Ocular hypertension, bilateral: Secondary | ICD-10-CM | POA: Diagnosis not present

## 2019-08-13 DIAGNOSIS — H40053 Ocular hypertension, bilateral: Secondary | ICD-10-CM | POA: Diagnosis not present

## 2019-08-15 ENCOUNTER — Telehealth: Payer: Self-pay | Admitting: Family Medicine

## 2019-08-15 NOTE — Telephone Encounter (Signed)
Patient line busy no voicemail setup

## 2019-09-13 ENCOUNTER — Other Ambulatory Visit: Payer: Self-pay | Admitting: Family Medicine

## 2019-09-13 MED ORDER — LEVOTHYROXINE SODIUM 50 MCG PO TABS: 50.0000 ug | ORAL_TABLET | Freq: Every day | ORAL | 0 refills | Status: DC

## 2019-09-13 NOTE — Telephone Encounter (Signed)
Requested medication (s) are due for refill today: yes  Requested medication (s) are on the active medication list: yes  Last refill: 07/31/2018  Future visit scheduled: no  Notes to clinic:  last TSH 09/16/2018    Requested Prescriptions  Pending Prescriptions Disp Refills   levothyroxine (SYNTHROID) 50 MCG tablet 90 tablet 1    Sig: Take 1 tablet (50 mcg total) by mouth daily.      Endocrinology:  Hypothyroid Agents Failed - 09/13/2019  8:57 AM      Failed - TSH needs to be rechecked within 3 months after an abnormal result. Refill until TSH is due.      Failed - TSH in normal range and within 360 days    TSH  Date Value Ref Range Status  09/22/2018 6.760 (H) 0.450 - 4.500 uIU/mL Final          Passed - Valid encounter within last 12 months    Recent Outpatient Visits           3 months ago Elevated brain natriuretic peptide (BNP) level   [facility] Hall, MD   10 months ago Epigastric abdominal pain   [facility] Birdie Sons, MD   11 months ago Annual physical exam   [facility] Birdie Sons, MD   1 year ago Cough   [facility] Birdie Sons, MD   1 year ago Acute URI   [place], [place]

## 2019-09-13 NOTE — Telephone Encounter (Signed)
levothyroxine (SYNTHROID) 50 MCG tablet     Patient is requesting a refill.    Pharmacy:  [id number] [name], [place] Phone:  [phone]  Fax:  [phone]

## 2019-12-06 DIAGNOSIS — D2261 Melanocytic nevi of right upper limb, including shoulder: Secondary | ICD-10-CM | POA: Diagnosis not present

## 2019-12-06 DIAGNOSIS — D2262 Melanocytic nevi of left upper limb, including shoulder: Secondary | ICD-10-CM | POA: Diagnosis not present

## 2019-12-06 DIAGNOSIS — D225 Melanocytic nevi of trunk: Secondary | ICD-10-CM | POA: Diagnosis not present

## 2019-12-06 DIAGNOSIS — Z85828 Personal history of other malignant neoplasm of skin: Secondary | ICD-10-CM | POA: Diagnosis not present

## 2019-12-06 DIAGNOSIS — L570 Actinic keratosis: Secondary | ICD-10-CM | POA: Diagnosis not present

## 2019-12-06 DIAGNOSIS — L821 Other seborrheic keratosis: Secondary | ICD-10-CM | POA: Diagnosis not present

## 2019-12-06 DIAGNOSIS — X32XXXA Exposure to sunlight, initial encounter: Secondary | ICD-10-CM | POA: Diagnosis not present

## 2019-12-07 NOTE — Progress Notes (Signed)
I,Roshena L Chambers,acting as a scribe for Lelon Huh, MD.,have documented all relevant documentation on the behalf of Lelon Huh, MD,as directed by  Lelon Huh, MD while in the presence of Lelon Huh, MD.  Established patient visit   Patient: Jesse Sandoval   DOB: [birth year]   86 y.o. Male  MRN: [id number] Visit Date: 12/10/2019  Today's healthcare provider: Lelon Huh, MD   Chief Complaint  Patient presents with   Hypothyroidism   Subjective    HPI  Hypothyroid, follow-up  Lab Results  Component Value Date   TSH 6.760 (H) 09/22/2018   TSH 4.680 (H) 09/15/2017   TSH 5.630 (H) 09/06/2016   T4TOTAL 5.4 09/06/2016   Wt Readings from Last 3 Encounters:  12/10/19 156 lb (70.8 kg)  05/23/19 167 lb (75.8 kg)  05/14/19 160 lb (72.6 kg)    He was last seen for hypothyroid 1 year ago.  Management since that visit includes continue same medication. He reports good compliance with treatment. He is not having side effects.   Symptoms: No change in energy level No constipation  No diarrhea No heat / cold intolerance  No nervousness No palpitations  No weight changes    ----------------------------------------------------------------------------------------- He does report occasional brief episodes off feeling weak that pass on their own within a few minutes. Have been occurring for many years and not changed recently. Otherwise feels well. Uses albuterol inhaler occasionally which he reports works well.    Medications: Outpatient Medications Prior to Visit  Medication Sig   acetaminophen (TYLENOL) 500 MG tablet Take 500 mg by mouth every 6 (six) hours as needed.   albuterol (PROVENTIL HFA;VENTOLIN HFA) 108 (90 Base) MCG/ACT inhaler Inhale 2 puffs into the lungs every 6 (six) hours as needed for wheezing or shortness of breath.   albuterol (VENTOLIN HFA) 108 (90 Base) MCG/ACT inhaler Inhale 2 puffs into the lungs every 6 (six) hours as needed for wheezing or  shortness of breath.   famotidine (PEPCID) 20 MG tablet Take 20 mg by mouth 2 (two) times daily as needed for heartburn or indigestion.   Fluticasone Propionate, Inhal, 100 MCG/BLIST AEPB Inhale 2 puffs into the lungs daily.   levothyroxine (SYNTHROID) 50 MCG tablet Take 1 tablet (50 mcg total) by mouth daily.   mesalamine (CANASA) 1000 MG suppository Place 1,000 mg rectally as needed.    montelukast (SINGULAIR) 10 MG tablet TAKE 1 BY MOUTH EVERY DAY.   timolol (TIMOPTIC) 0.5 % ophthalmic solution Place 1 drop into both eyes 2 (two) times daily.   [DISCONTINUED] fluticasone (FLONASE) 50 MCG/ACT nasal spray Place 2 sprays into both nostrils daily. (Patient not taking: Reported on 12/10/2019)   No facility-administered medications prior to visit.    Review of Systems  Constitutional: Negative for appetite change, chills and fever.  Respiratory: Negative for chest tightness, shortness of breath and wheezing.   Cardiovascular: Negative for chest pain and palpitations.  Gastrointestinal: Negative for abdominal pain, nausea and vomiting.      Objective    BP 131/64 (BP Location: Left Arm, Patient Position: Sitting, Cuff Size: Normal)    Pulse 64    Temp 97.8 F (36.6 C) (Oral)    Resp 16    Wt 156 lb (70.8 kg)    BMI 21.76 kg/m    Physical Exam  General appearance: Well developed, well nourished male, cooperative and in no acute distress Head: Normocephalic, without obvious abnormality, atraumatic Respiratory: Respirations even and unlabored, normal respiratory rate Extremities: All extremities  are intact.  Skin: Skin color, texture, turgor normal. No rashes seen  Psych: Appropriate mood and affect. Neurologic: Mental status: Alert, oriented to person, place, and time, thought content appropriate.   No results found for any visits on 12/10/19.  Assessment & Plan     1. Adult hypothyroidism Doing well current dose of levothyroxine.  - TSH  2. Chronic bronchitis, unspecified  chronic bronchitis type ([place]) Doing well current inhalers. Continue current medications.    3. Weakness Occasional brief transient episodes.  - Comprehensive metabolic panel - CBC   He declined flu vaccine today because he feels like it is too soon to get it. He plans on getting in a month or two.       The entirety of the information documented in the History of Present Illness, Review of Systems and Physical Exam were personally obtained by me. Portions of this information were initially documented by the CMA and reviewed by me for thoroughness and accuracy.      Lelon Huh, MD  [facility] [phone] (phone) [phone] (fax)  [place]

## 2019-12-10 ENCOUNTER — Ambulatory Visit (INDEPENDENT_AMBULATORY_CARE_PROVIDER_SITE_OTHER): Payer: Medicare Other | Admitting: Family Medicine

## 2019-12-10 ENCOUNTER — Encounter: Payer: Self-pay | Admitting: Family Medicine

## 2019-12-10 ENCOUNTER — Other Ambulatory Visit: Payer: Self-pay

## 2019-12-10 VITALS — BP 131/64 | HR 64 | Temp 97.8°F | Resp 16 | Wt 156.0 lb

## 2019-12-10 DIAGNOSIS — E039 Hypothyroidism, unspecified: Secondary | ICD-10-CM | POA: Diagnosis not present

## 2019-12-10 DIAGNOSIS — R531 Weakness: Secondary | ICD-10-CM

## 2019-12-10 DIAGNOSIS — J42 Unspecified chronic bronchitis: Secondary | ICD-10-CM | POA: Diagnosis not present

## 2019-12-11 ENCOUNTER — Telehealth: Payer: Self-pay

## 2019-12-11 LAB — COMPREHENSIVE METABOLIC PANEL
ALT: 10 IU/L (ref 0–44)
AST: 17 IU/L (ref 0–40)
Albumin/Globulin Ratio: 1.4 (ref 1.2–2.2)
Albumin: 3.7 g/dL (ref 3.6–4.6)
Alkaline Phosphatase: 95 IU/L (ref 44–121)
BUN/Creatinine Ratio: 18 (ref 10–24)
BUN: 21 mg/dL (ref 8–27)
Bilirubin Total: 0.2 mg/dL (ref 0.0–1.2)
CO2: 23 mmol/L (ref 20–29)
Calcium: 8.7 mg/dL (ref 8.6–10.2)
Chloride: 104 mmol/L (ref 96–106)
Creatinine, Ser: 1.19 mg/dL (ref 0.76–1.27)
GFR calc Af Amer: 64 mL/min/{1.73_m2} (ref >59)
GFR calc non Af Amer: 55 mL/min/{1.73_m2} — ABNORMAL LOW (ref >59)
Globulin, Total: 2.6 g/dL (ref 1.5–4.5)
Glucose: 96 mg/dL (ref 65–99)
Potassium: 4.4 mmol/L (ref 3.5–5.2)
Sodium: 138 mmol/L (ref 134–144)
Total Protein: 6.3 g/dL (ref 6.0–8.5)

## 2019-12-11 LAB — CBC
Hematocrit: 36.4 % — ABNORMAL LOW (ref 37.5–51.0)
Hemoglobin: 11.6 g/dL — ABNORMAL LOW (ref 13.0–17.7)
MCH: 29.9 pg (ref 26.6–33.0)
MCHC: 31.9 g/dL (ref 31.5–35.7)
MCV: 94 fL (ref 79–97)
Platelets: 209 10*3/uL (ref 150–450)
RBC: 3.88 x10E6/uL — ABNORMAL LOW (ref 4.14–5.80)
RDW: 12.9 % (ref 11.6–15.4)
WBC: 4.7 10*3/uL (ref 3.4–10.8)

## 2019-12-11 LAB — TSH: TSH: 5.79 u[IU]/mL — ABNORMAL HIGH (ref 0.450–4.500)

## 2019-12-11 NOTE — Telephone Encounter (Signed)
Tried calling patient and no answer. Will try again later. Ok for [facility] to advise as below.

## 2019-12-11 NOTE — Telephone Encounter (Signed)
-----   Message from Birdie Sons, MD sent at 12/11/2019  7:39 AM EDT ----- Is slightly anemic. Rest of labs are normal. Thyroid functions stable. Continue current medications.  Check labs yearly.

## 2019-12-12 NOTE — Telephone Encounter (Signed)
Attempted to contact patient. No answer. No other number in chart. Will call again  later.

## 2019-12-19 NOTE — Progress Notes (Signed)
This encounter was created in error - please disregard.

## 2019-12-20 ENCOUNTER — Other Ambulatory Visit: Payer: Self-pay

## 2020-01-08 DIAGNOSIS — H40053 Ocular hypertension, bilateral: Secondary | ICD-10-CM | POA: Diagnosis not present

## 2020-01-25 ENCOUNTER — Telehealth: Payer: Self-pay | Admitting: Family Medicine

## 2020-01-25 NOTE — Telephone Encounter (Signed)
Patient declined the Medicare Wellness Visit with [facility]. No reason given except that he does not feel it is necessary.

## 2020-01-29 ENCOUNTER — Other Ambulatory Visit: Payer: Self-pay | Admitting: Family Medicine

## 2020-01-29 DIAGNOSIS — J42 Unspecified chronic bronchitis: Secondary | ICD-10-CM

## 2020-01-29 DIAGNOSIS — Z23 Encounter for immunization: Secondary | ICD-10-CM | POA: Diagnosis not present

## 2020-01-29 NOTE — Telephone Encounter (Signed)
Requested medication (s) are due for refill today: yes  Requested medication (s) are on the active medication list: {yes  Last refill:  07/09/19  #1  5 refills  Future visit scheduled: No  Notes to clinic:  Dx code needed    Requested Prescriptions  Pending Prescriptions Disp Refills   FLOVENT DISKUS 100 MCG/BLIST AEPB [Pharmacy Med Name: [place] 100 MCG DISKUS] 180 each 1    Sig: INHALE 2 PUFFS BY MOUTH INTO THE LUNGS DAILY      Pulmonology:  Corticosteroids Passed - 01/29/2020  9:49 AM      Passed - Valid encounter within last 12 months    Recent Outpatient Visits           1 month ago Adult hypothyroidism   [facility] Birdie Sons, MD   8 months ago Elevated brain natriuretic peptide (BNP) level   [facility] Birdie Sons, MD   1 year ago Epigastric abdominal pain   [facility] Birdie Sons, MD   1 year ago Annual physical exam   [facility] Birdie Sons, MD   1 year ago Cough   [facility] Birdie Sons, MD

## 2020-02-05 ENCOUNTER — Other Ambulatory Visit: Payer: Self-pay | Admitting: Family Medicine

## 2020-02-26 DIAGNOSIS — Z23 Encounter for immunization: Secondary | ICD-10-CM | POA: Diagnosis not present

## 2020-05-05 ENCOUNTER — Other Ambulatory Visit: Payer: Self-pay | Admitting: Family Medicine

## 2020-06-15 ENCOUNTER — Encounter: Payer: Self-pay | Admitting: Emergency Medicine

## 2020-06-15 ENCOUNTER — Emergency Department
Admission: EM | Admit: 2020-06-15 | Discharge: 2020-06-15 | Disposition: A | Discharge status: Home / Self Care | Payer: Medicare Other | Attending: Student in an Organized Health Care Education/Training Program | Admitting: Student in an Organized Health Care Education/Training Program

## 2020-06-15 ENCOUNTER — Other Ambulatory Visit: Payer: Self-pay

## 2020-06-15 DIAGNOSIS — J45909 Unspecified asthma, uncomplicated: Secondary | ICD-10-CM | POA: Insufficient documentation

## 2020-06-15 DIAGNOSIS — B9789 Other viral agents as the cause of diseases classified elsewhere: Secondary | ICD-10-CM | POA: Diagnosis not present

## 2020-06-15 DIAGNOSIS — Z79899 Other long term (current) drug therapy: Secondary | ICD-10-CM | POA: Insufficient documentation

## 2020-06-15 DIAGNOSIS — R0981 Nasal congestion: Secondary | ICD-10-CM | POA: Diagnosis present

## 2020-06-15 DIAGNOSIS — Z7951 Long term (current) use of inhaled steroids: Secondary | ICD-10-CM | POA: Insufficient documentation

## 2020-06-15 DIAGNOSIS — J069 Acute upper respiratory infection, unspecified: Secondary | ICD-10-CM

## 2020-06-15 DIAGNOSIS — Z85828 Personal history of other malignant neoplasm of skin: Secondary | ICD-10-CM | POA: Insufficient documentation

## 2020-06-15 DIAGNOSIS — E039 Hypothyroidism, unspecified: Secondary | ICD-10-CM | POA: Diagnosis not present

## 2020-06-15 MED ORDER — DOXYCYCLINE HYCLATE 100 MG PO CAPS: 100.0000 mg | ORAL_CAPSULE | Freq: Two times a day (BID) | ORAL | 0 refills | Status: DC

## 2020-06-15 NOTE — Discharge Instructions (Signed)
Follow-up with your primary care provider if any continued problems or concerns.  Continue your asthma medication as prescribed by your doctor.  The prescription today was sent to your pharmacy.  Begin taking twice a day for the next 7 days.  If additional medication is needed you will need to call your primary care provider.

## 2020-06-15 NOTE — ED Triage Notes (Signed)
Patient states, "I need to see a doctor to get a prescription for antibiotics for my upper respiratory infection."  Patient states, "I have this every few years and have to get antibiotics for it."  Patient denies shortness of breath.

## 2020-06-15 NOTE — ED Provider Notes (Signed)
[facility] Emergency Department Provider Note   ____________________________________________   Event Date/Time   First MD Initiated Contact with Patient 06/15/20 1052     (approximate)  I have reviewed the triage vital signs and the nursing notes.   HISTORY  Chief Complaint Nasal Congestion   HPI Jesse Sandoval is a 86 y.o. male presents to the ED with complaint of congestion, cough without history of fever.  Patient states that he does not have any nausea, vomiting, diarrhea, body aches or chills.  Patient states he has been fully vaccinated against Covid.  He has a history of asthma and continues to use his inhalers as prescribed by his PCP.  Patient reports that he developed symptoms 4 days ago and "needs antibiotics" to get better.  Patient denies pain.       Past Medical History:  Diagnosis Date  . Barrett's esophagus   . Colitis 06/26/2013  . Diverticulosis   . Glaucoma   . History of colon polyps   . Prostate enlargement   . Prostatitis   . Skin cancer   . Temporomandibular joint disorders 08/15/2015  . Ulcerative proctitis [facility])     Patient Active Problem List   Diagnosis Date Noted  . Heart block AV first degree 09/15/2017  . Chronic bronchitis ([place]) 09/06/2016  . Restrictive lung disease 04/21/2016  . Allergic rhinitis 08/15/2015  . Basal cell carcinoma of skin 08/15/2015  . Benign enlargement of prostate 08/15/2015  . Diverticulosis of colon 08/15/2015  . Dermatitis, eczematoid 08/15/2015  . Glaucoma 08/15/2015  . Adult hypothyroidism 08/15/2015  . History of adenomatous polyp of colon 01/14/2015  . Chest tightness 06/26/2013    Past Surgical History:  Procedure Laterality Date  . COLONOSCOPY WITH PROPOFOL N/A 01/03/2015   Procedure: COLONOSCOPY WITH PROPOFOL;  Surgeon: Manya Silvas, MD;  Location: [facility] ENDOSCOPY;  Service: Endoscopy;  Laterality: N/A;  . CYSTECTOMY     chest area on the right  . EXTERNAL EAR SURGERY    .  HAND SURGERY    . HERNIA REPAIR     x3  . HERNIA REPAIR  [phone]  . PROSTATE SURGERY    . SEPTOPLASTY      Prior to Admission medications   Medication Sig Start Date End Date Taking? Authorizing Provider  acetaminophen (TYLENOL) 500 MG tablet Take 500 mg by mouth every 6 (six) hours as needed.    [provider]  albuterol (PROVENTIL HFA;VENTOLIN HFA) 108 (90 Base) MCG/ACT inhaler Inhale 2 puffs into the lungs every 6 (six) hours as needed for wheezing or shortness of breath. 07/21/17   Bacigalupo, Dionne Bucy, MD  albuterol (VENTOLIN HFA) 108 (90 Base) MCG/ACT inhaler Inhale 2 puffs into the lungs every 6 (six) hours as needed for wheezing or shortness of breath. 05/14/19   Delman Kitten, MD  doxycycline (VIBRAMYCIN) 100 MG capsule Take 1 capsule (100 mg total) by mouth 2 (two) times daily. 06/15/20   [name], PA-C  famotidine (PEPCID) 20 MG tablet Take 20 mg by mouth 2 (two) times daily as needed for heartburn or indigestion.    [provider]  FLOVENT DISKUS 100 MCG/BLIST AEPB INHALE 2 PUFFS BY MOUTH INTO THE LUNGS DAILY 01/29/20   Birdie Sons, MD  levothyroxine (SYNTHROID) 50 MCG tablet TAKE 1 TABLET BY MOUTH EVERY DAY 05/05/20   Birdie Sons, MD  mesalamine (CANASA) 1000 MG suppository Place 1,000 mg rectally as needed.     [provider]  montelukast (SINGULAIR) 10 MG tablet TAKE 1 BY MOUTH EVERY DAY. 07/25/19   Birdie Sons, MD  timolol (TIMOPTIC) 0.5 % ophthalmic solution Place 1 drop into both eyes 2 (two) times daily.    [provider]    Allergies Codeine, Penicillins, and Sulfa antibiotics  Family History  Problem Relation Age of Onset  . Heart disease Mother   . Heart failure Mother   . Hypertension Mother   . Cancer Father        pancreatic    Social History Social History   Tobacco Use  . Smoking status: Never Smoker  . Smokeless tobacco: Never Used  Vaping Use  . Vaping Use: Never used  Substance Use  Topics  . Alcohol use: No  . Drug use: No    Review of Systems Constitutional: No fever/chills Eyes: No visual changes. ENT: No sore throat.  Positive nasal congestion. Cardiovascular: Denies chest pain. Respiratory: Denies shortness of breath.  Occasional cough. Gastrointestinal: No abdominal pain.  No nausea, no vomiting.  No diarrhea. Musculoskeletal: Negative for skill skeletal pain. Skin: Negative for rash. Neurological: Negative for headaches, focal weakness or numbness. ____________________________________________   PHYSICAL EXAM:  VITAL SIGNS: ED Triage Vitals  Enc Vitals Group     BP 06/15/20 1017 (!) 151/64     Pulse Rate 06/15/20 1017 67     Resp 06/15/20 1017 16     Temp 06/15/20 1017 97.9 F (36.6 C)     Temp Source 06/15/20 1017 Oral     SpO2 06/15/20 1017 100 %     Weight 06/15/20 1017 165 lb (74.8 kg)     Height 06/15/20 1017 5' 11.5" (1.816 m)     Head Circumference --      Peak Flow --      Pain Score 06/15/20 1040 0     Pain Loc --      Pain Edu? --      Excl. in [place]? --     Constitutional: Alert and oriented. Well appearing and in no acute distress. Eyes: Conjunctivae are normal. PERRL. EOMI. Head: Atraumatic. Nose: Minimal congestion/rhinnorhea. Mouth/Throat: Mucous membranes are moist.  Oropharynx non-erythematous.  Uvula is midline and no exudate is noted. Neck: No stridor.   Cardiovascular: Normal rate, regular rhythm. Grossly normal heart sounds.  Good peripheral circulation. Respiratory: Normal respiratory effort.  No retractions. Lungs CTAB. Gastrointestinal: Soft and nontender. No distention.  Musculoskeletal: Moves upper and lower extremities with any difficulty and patient is ambulatory without any assistance. Neurologic:  Normal speech and language. No gross focal neurologic deficits are appreciated. No gait instability. Skin:  Skin is warm, dry and intact. No rash noted. Psychiatric: Mood and affect are normal. Speech and behavior are  normal.  ____________________________________________   LABS (all labs ordered are listed, but only abnormal results are displayed)  Labs Reviewed - No data to display ____________________________________________  PROCEDURES  Procedure(s) performed (including Critical Care):  Procedures   ____________________________________________   INITIAL IMPRESSION / ASSESSMENT AND PLAN / ED COURSE  As part of my medical decision making, I reviewed the following data within the electronic MEDICAL RECORD NUMBER Notes from prior ED visits and [place] Controlled Substance Database  87 year old male presents to the ED with what he believes to be an upper respiratory infection.  He is adamant that he gets antibiotics to get rid of this every time from his PCP.  He continues to take his inhalers for his documented asthma.  I explained that this  appears to be viral in nature as he is afebrile with nasal congestion and minimal cough.  In looking through his records he has had doxycycline by his PCP and patient states that this is what he wants.  He was made aware that if this is viral or allergies that the antibiotic will not work.  A prescription was sent to his pharmacy and he is to follow-up with his PCP if any continued problems.  ____________________________________________   FINAL CLINICAL IMPRESSION(S) / ED DIAGNOSES  Final diagnoses:  Viral URI with cough     ED Discharge Orders         Ordered    doxycycline (VIBRAMYCIN) 100 MG capsule  2 times daily,   Status:  Discontinued        06/15/20 1148    doxycycline (VIBRAMYCIN) 100 MG capsule  2 times daily        06/15/20 1151          *Please note:  TZVI ECONOMOU was evaluated in Emergency Department on 06/15/2020 for the symptoms described in the history of present illness. He was evaluated in the context of the global COVID-19 pandemic, which necessitated consideration that the patient might be at risk for infection with the SARS-CoV-2 virus  that causes COVID-19. Institutional protocols and algorithms that pertain to the evaluation of patients at risk for COVID-19 are in a state of rapid change based on information released by regulatory bodies including the CDC and federal and state organizations. These policies and algorithms were followed during the patient's care in the ED.  Some ED evaluations and interventions may be delayed as a result of limited staffing during and the pandemic.*   Note:  This document was prepared using Dragon voice recognition software and may include unintentional dictation errors.    [name], PA-C 06/15/20 1241    Merlyn Lot, MD 06/15/20 [phone]

## 2020-06-15 NOTE — ED Notes (Signed)
See triage note  Presents with possible upper resp infection  States developed some sx's on Thursday  Then felt better until this am  Afebrile on arrival and denies any fever at home

## 2020-07-08 DIAGNOSIS — H40053 Ocular hypertension, bilateral: Secondary | ICD-10-CM | POA: Diagnosis not present

## 2020-07-15 DIAGNOSIS — H2513 Age-related nuclear cataract, bilateral: Secondary | ICD-10-CM | POA: Diagnosis not present

## 2020-07-20 ENCOUNTER — Emergency Department
Admission: EM | Admit: 2020-07-20 | Discharge: 2020-07-20 | Disposition: A | Discharge status: Home / Self Care | Payer: Medicare Other | Attending: Emergency Medicine | Admitting: Emergency Medicine

## 2020-07-20 ENCOUNTER — Other Ambulatory Visit: Payer: Self-pay

## 2020-07-20 ENCOUNTER — Encounter: Payer: Self-pay | Admitting: Intensive Care

## 2020-07-20 ENCOUNTER — Emergency Department: Payer: Medicare Other

## 2020-07-20 DIAGNOSIS — R059 Cough, unspecified: Secondary | ICD-10-CM | POA: Diagnosis not present

## 2020-07-20 DIAGNOSIS — J4541 Moderate persistent asthma with (acute) exacerbation: Secondary | ICD-10-CM | POA: Diagnosis not present

## 2020-07-20 DIAGNOSIS — Z79899 Other long term (current) drug therapy: Secondary | ICD-10-CM | POA: Diagnosis not present

## 2020-07-20 DIAGNOSIS — R0602 Shortness of breath: Secondary | ICD-10-CM

## 2020-07-20 DIAGNOSIS — Z85828 Personal history of other malignant neoplasm of skin: Secondary | ICD-10-CM | POA: Insufficient documentation

## 2020-07-20 DIAGNOSIS — J45909 Unspecified asthma, uncomplicated: Secondary | ICD-10-CM | POA: Diagnosis not present

## 2020-07-20 DIAGNOSIS — E039 Hypothyroidism, unspecified: Secondary | ICD-10-CM | POA: Insufficient documentation

## 2020-07-20 HISTORY — DX: Unspecified asthma, uncomplicated: J45.909

## 2020-07-20 LAB — CBC WITH DIFFERENTIAL/PLATELET
Abs Immature Granulocytes: 0.02 10*3/uL (ref 0.00–0.07)
Basophils Absolute: 0 10*3/uL (ref 0.0–0.1)
Basophils Relative: 0 %
Eosinophils Absolute: 0.4 10*3/uL (ref 0.0–0.5)
Eosinophils Relative: 5 %
HCT: 35 % — ABNORMAL LOW (ref 39.0–52.0)
Hemoglobin: 11.8 g/dL — ABNORMAL LOW (ref 13.0–17.0)
Immature Granulocytes: 0 %
Lymphocytes Relative: 10 %
Lymphs Abs: 0.8 10*3/uL (ref 0.7–4.0)
MCH: 31.6 pg (ref 26.0–34.0)
MCHC: 33.7 g/dL (ref 30.0–36.0)
MCV: 93.6 fL (ref 80.0–100.0)
Monocytes Absolute: 0.7 10*3/uL (ref 0.1–1.0)
Monocytes Relative: 9 %
Neutro Abs: 5.8 10*3/uL (ref 1.7–7.7)
Neutrophils Relative %: 76 %
Platelets: 190 10*3/uL (ref 150–400)
RBC: 3.74 MIL/uL — ABNORMAL LOW (ref 4.22–5.81)
RDW: 13.6 % (ref 11.5–15.5)
WBC: 7.6 10*3/uL (ref 4.0–10.5)
nRBC: 0 % (ref 0.0–0.2)

## 2020-07-20 LAB — BASIC METABOLIC PANEL
Anion gap: 9 (ref 5–15)
BUN: 24 mg/dL — ABNORMAL HIGH (ref 8–23)
CO2: 26 mmol/L (ref 22–32)
Calcium: 8.9 mg/dL (ref 8.9–10.3)
Chloride: 106 mmol/L (ref 98–111)
Creatinine, Ser: 1.21 mg/dL (ref 0.61–1.24)
GFR, Estimated: 58 mL/min — ABNORMAL LOW (ref >60)
Glucose, Bld: 147 mg/dL — ABNORMAL HIGH (ref 70–99)
Potassium: 4 mmol/L (ref 3.5–5.1)
Sodium: 141 mmol/L (ref 135–145)

## 2020-07-20 LAB — TROPONIN I (HIGH SENSITIVITY): Troponin I (High Sensitivity): 6 ng/L (ref <18)

## 2020-07-20 MED ORDER — IPRATROPIUM-ALBUTEROL 0.5-2.5 (3) MG/3ML IN SOLN
6.0000 mL | Freq: Once | RESPIRATORY_TRACT | Status: AC
  Administered 2020-07-20: 6 mL via RESPIRATORY_TRACT
  Filled 2020-07-20: qty 6

## 2020-07-20 MED ORDER — METHYLPREDNISOLONE SODIUM SUCC 125 MG IJ SOLR
125.0000 mg | Freq: Once | INTRAMUSCULAR | Status: AC
  Administered 2020-07-20: 125 mg via INTRAVENOUS
  Filled 2020-07-20: qty 2

## 2020-07-20 MED ORDER — PREDNISONE 10 MG (21) PO TBPK: ORAL_TABLET | ORAL | 0 refills | Status: DC

## 2020-07-20 MED ORDER — MAGNESIUM SULFATE 2 GM/50ML IV SOLN
2.0000 g | Freq: Once | INTRAVENOUS | Status: AC
  Administered 2020-07-20: 2 g via INTRAVENOUS
  Filled 2020-07-20: qty 50

## 2020-07-20 NOTE — ED Provider Notes (Signed)
[facility] Emergency Department Provider Note ____________________________________________   Event Date/Time   First MD Initiated Contact with Patient 07/20/20 [phone]     (approximate)  I have reviewed the triage vital signs and the nursing notes.  HISTORY  Chief Complaint Cough and Shortness of Breath   HPI Jesse Sandoval is a 87 y.o. malewho presents to the ED for evaluation of cough and SOB.   Chart review indicates history of asthma and BPH.  Hypothyroidism.  Flovent and Singulair controller medications. Patient reports compliance with medications, but despite this having had increasing shortness of breath and nonproductive cough over the past 2-3 days.  Denies fevers, syncopal episodes.  Reports recurrent coughing causing bilateral chest soreness to his rib cage.  Denies substernal chest pressure or pain with exertion.  Does report shortness of breath exertion and again recurrent nonproductive coughing primarily.  Denies sore throat, abdominal pain, emesis.   Past Medical History:  Diagnosis Date  . Asthma   . Barrett's esophagus   . Colitis 06/26/2013  . Diverticulosis   . Glaucoma   . History of colon polyps   . Prostate enlargement   . Prostatitis   . Skin cancer   . Temporomandibular joint disorders 08/15/2015  . Ulcerative proctitis [facility])     Patient Active Problem List   Diagnosis Date Noted  . Heart block AV first degree 09/15/2017  . Chronic bronchitis ([place]) 09/06/2016  . Restrictive lung disease 04/21/2016  . Allergic rhinitis 08/15/2015  . Basal cell carcinoma of skin 08/15/2015  . Benign enlargement of prostate 08/15/2015  . Diverticulosis of colon 08/15/2015  . Dermatitis, eczematoid 08/15/2015  . Glaucoma 08/15/2015  . Adult hypothyroidism 08/15/2015  . History of adenomatous polyp of colon 01/14/2015  . Chest tightness 06/26/2013    Past Surgical History:  Procedure Laterality Date  . COLONOSCOPY WITH PROPOFOL N/A  01/03/2015   Procedure: COLONOSCOPY WITH PROPOFOL;  Surgeon: Manya Silvas, MD;  Location: [facility] ENDOSCOPY;  Service: Endoscopy;  Laterality: N/A;  . CYSTECTOMY     chest area on the right  . EXTERNAL EAR SURGERY    . HAND SURGERY    . HERNIA REPAIR     x3  . HERNIA REPAIR  [phone]  . PROSTATE SURGERY    . SEPTOPLASTY      Prior to Admission medications   Medication Sig Start Date End Date Taking? Authorizing Provider  albuterol (PROVENTIL HFA;VENTOLIN HFA) 108 (90 Base) MCG/ACT inhaler Inhale 2 puffs into the lungs every 6 (six) hours as needed for wheezing or shortness of breath. 07/21/17  Yes Bacigalupo, Dionne Bucy, MD  albuterol (VENTOLIN HFA) 108 (90 Base) MCG/ACT inhaler Inhale 2 puffs into the lungs every 6 (six) hours as needed for wheezing or shortness of breath. 05/14/19  Yes Delman Kitten, MD  FLOVENT DISKUS 100 MCG/BLIST AEPB INHALE 2 PUFFS BY MOUTH INTO THE LUNGS DAILY Patient taking differently: Inhale 2 puffs into the lungs daily. 01/29/20  Yes Birdie Sons, MD  latanoprost (XALATAN) 0.005 % ophthalmic solution Place 1 drop into both eyes daily. 06/03/20  Yes [provider]  levothyroxine (SYNTHROID) 50 MCG tablet TAKE 1 TABLET BY MOUTH EVERY DAY 05/05/20  Yes Birdie Sons, MD  predniSONE (STERAPRED UNI-PAK 21 TAB) 10 MG (21) TBPK tablet Take 5 tablets (50 mg total) by mouth daily for 4 days, THEN 2 tablets (20 mg total) daily for 2 days, THEN 1 tablet (10 mg total) daily for 2 days. 07/20/20 07/28/20  Yes Vladimir Crofts, MD  acetaminophen (TYLENOL) 500 MG tablet Take 500 mg by mouth every 6 (six) hours as needed.    [provider]  doxycycline (VIBRAMYCIN) 100 MG capsule Take 1 capsule (100 mg total) by mouth 2 (two) times daily. Patient not taking: No sig reported 06/15/20   [name], PA-C  famotidine (PEPCID) 20 MG tablet Take 20 mg by mouth 2 (two) times daily as needed for heartburn or indigestion.    [provider]  mesalamine  (CANASA) 1000 MG suppository Place 1,000 mg rectally as needed.     [provider]  montelukast (SINGULAIR) 10 MG tablet TAKE 1 BY MOUTH EVERY DAY. Patient taking differently: Take 10 mg by mouth at bedtime. 07/25/19   Birdie Sons, MD  timolol (TIMOPTIC) 0.5 % ophthalmic solution Place 1 drop into both eyes 2 (two) times daily. Patient not taking: No sig reported    [provider]    Allergies Codeine, Penicillins, and Sulfa antibiotics  Family History  Problem Relation Age of Onset  . Heart disease Mother   . Heart failure Mother   . Hypertension Mother   . Cancer Father        pancreatic    Social History Social History   Tobacco Use  . Smoking status: Never Smoker  . Smokeless tobacco: Never Used  Vaping Use  . Vaping Use: Never used  Substance Use Topics  . Alcohol use: No  . Drug use: No    Review of Systems  Constitutional: No fever/chills Eyes: No visual changes. ENT: No sore throat. Cardiovascular: Denies chest pain. Respiratory: Positive shortness of breath, dyspnea on exertion and nonproductive cough Gastrointestinal: No abdominal pain.  No nausea, no vomiting.  No diarrhea.  No constipation. Genitourinary: Negative for dysuria. Musculoskeletal: Negative for back pain. Skin: Negative for rash. Neurological: Negative for headaches, focal weakness or numbness.  ____________________________________________   PHYSICAL EXAM:  VITAL SIGNS: Vitals:   07/20/20 1136 07/20/20 1200  BP: (!) 145/58 (!) 144/68  Pulse: 85 84  Resp: (!) 21 17  Temp:    SpO2: 91% 97%     Constitutional: Alert and oriented.  Appears uncomfortable, but in no acute distress.  Speaking in phrases only.  Occasional nonproductive coughing during my evaluation. Eyes: Conjunctivae are normal. PERRL. EOMI. Head: Atraumatic. Nose: No congestion/rhinnorhea. Mouth/Throat: Mucous membranes are moist.  Oropharynx non-erythematous. Neck: No stridor. No cervical  spine tenderness to palpation. Cardiovascular: Normal rate, regular rhythm. Grossly normal heart sounds.  Good peripheral circulation. Respiratory: Tachypneic to the mid 20s.  No retractions.  Diffuse expiratory wheezes with decreased air movement throughout. Gastrointestinal: Soft , nondistended, nontender to palpation. No CVA tenderness. Musculoskeletal: No lower extremity tenderness nor edema.  No joint effusions. No signs of acute trauma. Neurologic:  Normal speech and language. No gross focal neurologic deficits are appreciated. No gait instability noted. Skin:  Skin is warm, dry and intact. No rash noted. Psychiatric: Mood and affect are normal. Speech and behavior are normal.  ____________________________________________   LABS (all labs ordered are listed, but only abnormal results are displayed)  Labs Reviewed  CBC WITH DIFFERENTIAL/PLATELET - Abnormal; Notable for the following components:      Result Value   RBC 3.74 (*)    Hemoglobin 11.8 (*)    HCT 35.0 (*)    All other components within normal limits  BASIC METABOLIC PANEL - Abnormal; Notable for the following components:   Glucose, Bld 147 (*)  BUN 24 (*)    GFR, Estimated 58 (*)    All other components within normal limits  TROPONIN I (HIGH SENSITIVITY)   ____________________________________________  12 Lead EKG Sinus rhythm, rate of 80 bpm.  Normal axis slightly prolonged PR interval at 226 ms and right bundle branch block.  No evidence of acute ischemia.  Similar to previous from 04/2019  ____________________________________________  RADIOLOGY  ED MD interpretation: 2 view CXR reviewed by me without evidence of acute cardiopulmonary pathology.  Official radiology report(s): DG Chest 2 View  Result Date: 07/20/2020 CLINICAL DATA:  Shortness of breath and dry cough. EXAM: CHEST - 2 VIEW COMPARISON:  05/14/2019 FINDINGS: The heart size and mediastinal contours are within normal limits. Both lungs are clear.  The visualized skeletal structures are unremarkable. IMPRESSION: No active cardiopulmonary disease. Electronically Signed   By: Kerby Moors M.D.   On: 07/20/2020 10:17    ____________________________________________   PROCEDURES and INTERVENTIONS  Procedure(s) performed (including Critical Care):  .1-3 Lead EKG Interpretation Performed by: Vladimir Crofts, MD Authorized by: Vladimir Crofts, MD     Interpretation: normal     ECG rate:  90   ECG rate assessment: normal     Rhythm: sinus rhythm     Ectopy: none     Conduction: normal      Medications  ipratropium-albuterol (DUONEB) 0.5-2.5 (3) MG/3ML nebulizer solution 6 mL (6 mLs Nebulization Given 07/20/20 1105)  methylPREDNISolone sodium succinate (SOLU-MEDROL) 125 mg/2 mL injection 125 mg (125 mg Intravenous Given 07/20/20 1101)  magnesium sulfate IVPB 2 g 50 mL (0 g Intravenous Stopped 07/20/20 1219)    ____________________________________________   MDM / ED COURSE   87 year old male with moderately severe asthma presents to the ED with acute exacerbation, ultimately amenable to outpatient management.  Normal vitals on room air.  Exam initially with a rather uncomfortable-appearing patient with poor airflow on auscultation with diffuse expiratory wheezes and recurrent cough.  His work-up is benign without evidence of ACS, infiltrate or pneumonia, PTX or further acute derangements.  EKG is nonischemic.  Rapid improvement of clinical picture with duo nebs and steroids.  Further provided IV magnesium.  Patient requesting discharge upon my reevaluation, and I think this is reasonable.  We discussed prednisone taper, continuing his outpatient inhalers and following up with his PCP.  Return precautions for the ED were discussed  Clinical Course as of 07/20/20 1249  Sun Jul 20, 2020  1202 Reassessed.  Patient clinically much improved.  No longer tachypneic.  Repeat auscultation reveals continued expiratory wheezes, though normalization of  airflow.  We discussed repeat embolization versus observation admission versus discharge.  Patient requests discharge.  We discussed outpatient management and return precautions for the ED. [DS]    Clinical Course User Index [DS] Vladimir Crofts, MD    ____________________________________________   FINAL CLINICAL IMPRESSION(S) / ED DIAGNOSES  Final diagnoses:  Moderate persistent asthma with exacerbation  Cough  Shortness of breath     ED Discharge Orders         Ordered    predniSONE (STERAPRED UNI-PAK 21 TAB) 10 MG (21) TBPK tablet        07/20/20 1226           Jesse Sandoval   Note:  This document was prepared using Systems analyst and may include unintentional dictation errors.   Vladimir Crofts, MD 07/20/20 [phone]

## 2020-07-20 NOTE — ED Triage Notes (Signed)
Patient c/o cough and sob since Saturday. HX asthma. Denies pain. Little relief with inhalers at home

## 2020-07-20 NOTE — ED Notes (Signed)
Provided discharge instructions. Verbalized understanding.  

## 2020-07-20 NOTE — ED Notes (Signed)
Pt taken for xray

## 2020-07-20 NOTE — Discharge Instructions (Signed)
As we discussed, please pick up prescription for prednisone steroids at the pharmacy and start taking his medications tomorrow.  We gave you the dose of steroids for today while in the ED.  Continue all of your other medications and inhalers.  Return to the ED with any further worsening symptoms despite these medications.

## 2020-07-20 NOTE — ED Notes (Signed)
Patients wife arrive at bedside.

## 2020-07-28 ENCOUNTER — Telehealth: Payer: Self-pay

## 2020-07-28 ENCOUNTER — Telehealth (INDEPENDENT_AMBULATORY_CARE_PROVIDER_SITE_OTHER): Payer: Medicare Other | Admitting: Family Medicine

## 2020-07-28 ENCOUNTER — Encounter: Payer: Self-pay | Admitting: Family Medicine

## 2020-07-28 DIAGNOSIS — J301 Allergic rhinitis due to pollen: Secondary | ICD-10-CM | POA: Diagnosis not present

## 2020-07-28 DIAGNOSIS — J40 Bronchitis, not specified as acute or chronic: Secondary | ICD-10-CM | POA: Diagnosis not present

## 2020-07-28 MED ORDER — DOXYCYCLINE HYCLATE 100 MG PO CAPS: 100.0000 mg | ORAL_CAPSULE | Freq: Two times a day (BID) | ORAL | 0 refills | Status: DC

## 2020-07-28 NOTE — Progress Notes (Signed)
Virtual telephone visit    Virtual Visit via Telephone Note   This visit type was conducted due to national recommendations for restrictions regarding the COVID-19 Pandemic (e.g. social distancing) in an effort to limit this patient's exposure and mitigate transmission in our community. Due to his co-morbid illnesses, this patient is at least at moderate risk for complications without adequate follow up. This format is felt to be most appropriate for this patient at this time. The patient did not have access to video technology or had technical difficulties with video requiring transitioning to audio format only (telephone). Physical exam was limited to content and character of the telephone converstion.    Patient location: home Provider location: [facility]  I discussed the limitations of evaluation and management by telemedicine and the availability of in person appointments. The patient expressed understanding and agreed to proceed.   Visit Date: 07/28/2020  Today's healthcare provider: Lelon Huh, MD   Chief Complaint  Patient presents with  . Cough   Subjective     This is a recurrent problem. Episode onset: 9 days ago. The problem has been unchanged. The problem occurs constantly. Associated symptoms include postnasal drip, rhinorrhea and shortness of breath. Pertinent negatives include no chest pain, chills, fever or wheezing. He has tried steroid inhaler (also Prednisone taper prescribed from ER on the 24th, and Doxycycline prescribed at previous visit in March) for the symptoms. The treatment provided no relief. His past medical history is significant for asthma.     Patient reports that symptoms started out as flu like symptoms including headache, sinus pressure, continuous cough, post nasal drainage and body aches.  Patient was seen in the ER on 07/20/2020 for Asthma exacerbation, cough and shortness of breath. He was prescribed a 7 day course of prednisone taper.    He has  been on montelukast for the last years.     Medications: Outpatient Medications Prior to Visit  Medication Sig  . acetaminophen (TYLENOL) 500 MG tablet Take 500 mg by mouth every 6 (six) hours as needed.  Marland Kitchen albuterol (VENTOLIN HFA) 108 (90 Base) MCG/ACT inhaler Inhale 2 puffs into the lungs every 6 (six) hours as needed for wheezing or shortness of breath.  . famotidine (PEPCID) 20 MG tablet Take 20 mg by mouth 2 (two) times daily as needed for heartburn or indigestion.  Marland Kitchen FLOVENT DISKUS 100 MCG/BLIST AEPB INHALE 2 PUFFS BY MOUTH INTO THE LUNGS DAILY (Patient taking differently: Inhale 2 puffs into the lungs daily.)  . latanoprost (XALATAN) 0.005 % ophthalmic solution Place 1 drop into both eyes daily.  Marland Kitchen levothyroxine (SYNTHROID) 50 MCG tablet TAKE 1 TABLET BY MOUTH EVERY DAY  . mesalamine (CANASA) 1000 MG suppository Place 1,000 mg rectally as needed.   . montelukast (SINGULAIR) 10 MG tablet TAKE 1 BY MOUTH EVERY DAY. (Patient taking differently: Take 10 mg by mouth at bedtime.)  . [DISCONTINUED] albuterol (PROVENTIL HFA;VENTOLIN HFA) 108 (90 Base) MCG/ACT inhaler Inhale 2 puffs into the lungs every 6 (six) hours as needed for wheezing or shortness of breath.  . [DISCONTINUED] doxycycline (VIBRAMYCIN) 100 MG capsule Take 1 capsule (100 mg total) by mouth 2 (two) times daily. (Patient not taking: No sig reported)  . [DISCONTINUED] predniSONE (STERAPRED UNI-PAK 21 TAB) 10 MG (21) TBPK tablet Take 5 tablets (50 mg total) by mouth daily for 4 days, THEN 2 tablets (20 mg total) daily for 2 days, THEN 1 tablet (10 mg total) daily for 2 days. (Patient not taking: Reported  on 07/28/2020)  . [DISCONTINUED] timolol (TIMOPTIC) 0.5 % ophthalmic solution Place 1 drop into both eyes 2 (two) times daily. (Patient not taking: No sig reported)   No facility-administered medications prior to visit.    Review of Systems  Constitutional: Negative for appetite change, chills and fever.  HENT: Positive for  congestion, postnasal drip and rhinorrhea.   Respiratory: Positive for cough and shortness of breath. Negative for chest tightness and wheezing.   Cardiovascular: Negative for chest pain and palpitations.  Gastrointestinal: Negative for abdominal pain, nausea and vomiting.      Objective       Awake, alert, oriented x 3. In no apparent distress   Assessment & Plan     1. Bronchitis  - doxycycline (VIBRAMYCIN) 100 MG capsule; Take 1 capsule (100 mg total) by mouth 2 (two) times daily.  Dispense: 14 capsule; Refill: 0  Advised to have Covid test if not rapidly improving.   2. Allergic rhinitis due to pollen, unspecified seasonality Continue montelukast and advised could also take Allegra or Zyrtec once a day.       I discussed the assessment and treatment plan with the patient. The patient was provided an opportunity to ask questions and all were answered. The patient agreed with the plan and demonstrated an understanding of the instructions.   The patient was advised to call back or seek an in-person evaluation if the symptoms worsen or if the condition fails to improve as anticipated.  I provided 11 minutes of non-face-to-face time during this encounter.  The entirety of the information documented in the History of Present Illness, Review of Systems and Physical Exam were personally obtained by me. Portions of this information were initially documented by the CMA and reviewed by me for thoroughness and accuracy.     Lelon Huh, MD [facility] [phone] (phone) [phone] (fax)  [place]

## 2020-07-28 NOTE — Telephone Encounter (Signed)
Please advise if appointment needs to be virtual since patient still has cough.

## 2020-07-28 NOTE — Telephone Encounter (Signed)
Virtual visit scheduled today at 2pm.

## 2020-07-28 NOTE — Patient Instructions (Signed)
.   Please review the attached list of medications and notify my office if there are any errors.   . Please bring all of your medications to every appointment so we can make sure that our medication list is the same as yours.   

## 2020-07-28 NOTE — Telephone Encounter (Signed)
Copied from [place] [phone]. Topic: General - Other >> Jul 28, 2020  9:11 AM Celene Kras wrote: Reason for CRM: Pt called stating that he is needing to have a hosp f/u. He states that he is still experiencing a very bad cough and did not pass DT. Pt is requesting to have PCP see him in office. Please advise.

## 2020-07-28 NOTE — Telephone Encounter (Signed)
Will need to be a virtual visit. If he is not improving then he needs to get a covid test.

## 2020-11-18 ENCOUNTER — Other Ambulatory Visit: Payer: Self-pay | Admitting: Family Medicine

## 2020-11-18 DIAGNOSIS — J42 Unspecified chronic bronchitis: Secondary | ICD-10-CM

## 2020-12-08 DIAGNOSIS — Z85828 Personal history of other malignant neoplasm of skin: Secondary | ICD-10-CM | POA: Diagnosis not present

## 2020-12-08 DIAGNOSIS — D2262 Melanocytic nevi of left upper limb, including shoulder: Secondary | ICD-10-CM | POA: Diagnosis not present

## 2020-12-08 DIAGNOSIS — L570 Actinic keratosis: Secondary | ICD-10-CM | POA: Diagnosis not present

## 2020-12-08 DIAGNOSIS — C44222 Squamous cell carcinoma of skin of right ear and external auricular canal: Secondary | ICD-10-CM | POA: Diagnosis not present

## 2020-12-08 DIAGNOSIS — L821 Other seborrheic keratosis: Secondary | ICD-10-CM | POA: Diagnosis not present

## 2020-12-08 DIAGNOSIS — D2261 Melanocytic nevi of right upper limb, including shoulder: Secondary | ICD-10-CM | POA: Diagnosis not present

## 2020-12-08 DIAGNOSIS — X32XXXA Exposure to sunlight, initial encounter: Secondary | ICD-10-CM | POA: Diagnosis not present

## 2020-12-08 DIAGNOSIS — C44212 Basal cell carcinoma of skin of right ear and external auricular canal: Secondary | ICD-10-CM | POA: Diagnosis not present

## 2020-12-08 DIAGNOSIS — D485 Neoplasm of uncertain behavior of skin: Secondary | ICD-10-CM | POA: Diagnosis not present

## 2021-01-03 ENCOUNTER — Other Ambulatory Visit: Payer: Self-pay | Admitting: Family Medicine

## 2021-01-12 DIAGNOSIS — H40053 Ocular hypertension, bilateral: Secondary | ICD-10-CM | POA: Diagnosis not present

## 2021-01-13 DIAGNOSIS — C44212 Basal cell carcinoma of skin of right ear and external auricular canal: Secondary | ICD-10-CM | POA: Diagnosis not present

## 2021-01-13 DIAGNOSIS — C44222 Squamous cell carcinoma of skin of right ear and external auricular canal: Secondary | ICD-10-CM | POA: Diagnosis not present

## 2021-01-13 DIAGNOSIS — L905 Scar conditions and fibrosis of skin: Secondary | ICD-10-CM | POA: Diagnosis not present

## 2021-01-27 DIAGNOSIS — Z23 Encounter for immunization: Secondary | ICD-10-CM | POA: Diagnosis not present

## 2021-03-05 ENCOUNTER — Ambulatory Visit: Payer: Self-pay

## 2021-03-05 ENCOUNTER — Encounter: Payer: Self-pay | Admitting: Family Medicine

## 2021-03-05 ENCOUNTER — Ambulatory Visit (INDEPENDENT_AMBULATORY_CARE_PROVIDER_SITE_OTHER): Payer: Medicare Other | Admitting: Family Medicine

## 2021-03-05 DIAGNOSIS — J069 Acute upper respiratory infection, unspecified: Secondary | ICD-10-CM | POA: Diagnosis not present

## 2021-03-05 MED ORDER — BENZONATATE 100 MG PO CAPS: 100.0000 mg | ORAL_CAPSULE | Freq: Two times a day (BID) | ORAL | 0 refills | Status: DC | PRN

## 2021-03-05 NOTE — Telephone Encounter (Signed)
Noted  Pt has an appointment telephone visit scheduled for today.  KP

## 2021-03-05 NOTE — Telephone Encounter (Signed)
Pt called, states he has URI symptoms of cough and nasal congestion that SOB that has been going on since Sunday. Pt states he is taking Nyquil OTC and using rescue inhaler. Pt is asking for appt, appt was scheduled for tele visit at 1100 today. Care advice given and pt verbalized understanding. No other questions/concerns noted.     Message from [place] sent at 03/05/2021  9:18 AM EST  Pt called saying he has upper symptoms, tired , achey, no fever.  He would like to be seen today.     CB#  [phone]    Reason for Disposition  [1] Fever returns after gone for over 24 hours AND [2] symptoms worse or not improved  Answer Assessment - Initial Assessment Questions 1. LOCATION: "Where does it hurt?"      none 2. ONSET: "When did the sinus pain start?"  (e.g., hours, days)      Sunday evening 3. SEVERITY: "How bad is the pain?"   (Scale 1-10; mild, moderate or severe)   - MILD (1-3): doesn't interfere with normal activities    - MODERATE (4-7): interferes with normal activities (e.g., work or school) or awakens from sleep   - SEVERE (8-10): excruciating pain and patient unable to do any normal activities        moderate 4. RECURRENT SYMPTOM: "Have you ever had sinus problems before?" If Yes, ask: "When was the last time?" and "What happened that time?"      Yes 5. NASAL CONGESTION: "Is the nose blocked?" If Yes, ask: "Can you open it or must you breathe through your mouth?"     yes 6. NASAL DISCHARGE: "Do you have discharge from your nose?" If so ask, "What color?"     white 7. FEVER: "Do you have a fever?" If Yes, ask: "What is it, how was it measured, and when did it start?"      No 8. OTHER SYMPTOMS: "Do you have any other symptoms?" (e.g., sore throat, cough, earache, difficulty breathing)     Cough, head congestion  9. PREGNANCY: "Is there any chance you are pregnant?" "When was your last menstrual period?"     NA  Protocols used: Sinus Pain or Congestion-A-AH

## 2021-03-05 NOTE — Progress Notes (Signed)
Virtual telephone visit    Virtual Visit via Telephone Note   This visit type was conducted due to national recommendations for restrictions regarding the COVID-19 Pandemic (e.g. social distancing) in an effort to limit this patient's exposure and mitigate transmission in our community. Due to his co-morbid illnesses, this patient is at least at moderate risk for complications without adequate follow up. This format is felt to be most appropriate for this patient at this time. The patient did not have access to video technology or had technical difficulties with video requiring transitioning to audio format only (telephone). Physical exam was limited to content and character of the telephone converstion.     Patient location: home Provider location: [place] involved in the visit: patient, provider  I discussed the limitations of evaluation and management by telemedicine and the availability of in person appointments. The patient expressed understanding and agreed to proceed.   Visit Date: 03/05/2021  Today's healthcare provider: Lavon Paganini, MD   Chief Complaint  Patient presents with   URI   Subjective    HPI HPI     URI   Associated symptoms inlclude congestion, cough, rhinorrhea, shortness of breath, sinus pain and wheezing.  Recent episode started in the past 7 days.  The problem has been unchanged since onset.  The temperature has been with in normal range.  Past hisotry is significant for  asthma.        Comments   Patient reports cough is worse at night. he is taking Nyquil, reports no symptom improvement. Patient using albuterol at least twice a day this last few days.       Last edited by Dorian Pod, B[place] on 03/05/2021 11:09 AM.       x4 days Had URI in March - seen in the ED and not given abx States that he gets antibiotics yearly for this - wants doxycycline   Medications: Outpatient Medications Prior to  Visit  Medication Sig   acetaminophen (TYLENOL) 500 MG tablet Take 500 mg by mouth every 6 (six) hours as needed.   albuterol (VENTOLIN HFA) 108 (90 Base) MCG/ACT inhaler Inhale 2 puffs into the lungs every 6 (six) hours as needed for wheezing or shortness of breath.   famotidine (PEPCID) 20 MG tablet Take 20 mg by mouth 2 (two) times daily as needed for heartburn or indigestion.   FLOVENT DISKUS 100 MCG/BLIST AEPB INHALE 2 PUFFS BY MOUTH INTO THE LUNGS DAILY   latanoprost (XALATAN) 0.005 % ophthalmic solution Place 1 drop into both eyes daily.   levothyroxine (SYNTHROID) 50 MCG tablet TAKE 1 TABLET BY MOUTH EVERY DAY   mesalamine (CANASA) 1000 MG suppository Place 1,000 mg rectally as needed.    montelukast (SINGULAIR) 10 MG tablet TAKE 1 TABLET BY MOUTH EVERY DAY   [DISCONTINUED] doxycycline (VIBRAMYCIN) 100 MG capsule Take 1 capsule (100 mg total) by mouth 2 (two) times daily. (Patient not taking: Reported on 03/05/2021)   No facility-administered medications prior to visit.    Review of Systems  Constitutional:  Negative for chills, fatigue and fever.  HENT:  Positive for congestion, sinus pressure and sinus pain. Negative for ear pain and sore throat.   Respiratory:  Positive for cough, shortness of breath and wheezing.   Cardiovascular:  Negative for chest pain and palpitations.     Objective    There were no vitals taken for this visit.     Assessment & Plan     1.  Viral URI with cough - symptoms and exam c/w viral URI - no evidence of strep pharyngitis, CAP, AOM, bacterial sinusitis, or other bacterial infection - discussed that antibiotics are not indicated for viral infections - denies any SOB, wheezing, or purluent sputum production suggestive of pneumonia or bacterial process - declines flu and covid testing at this time - I recommended - discussed symptomatic management, natural course, and return precautions   Meds ordered this encounter  Medications    benzonatate (TESSALON) 100 MG capsule    Sig: Take 1 capsule (100 mg total) by mouth 2 (two) times daily as needed for cough.    Dispense:  20 capsule    Refill:  0     Return if symptoms worsen or fail to improve.    I discussed the assessment and treatment plan with the patient. The patient was provided an opportunity to ask questions and all were answered. The patient agreed with the plan and demonstrated an understanding of the instructions.   The patient was advised to call back or seek an in-person evaluation if the symptoms worsen or if the condition fails to improve as anticipated.  I provided 12 minutes of non-face-to-face time during this encounter.  I, Lavon Paganini, MD, have reviewed all documentation for this visit. The documentation on 03/05/21 for the exam, diagnosis, procedures, and orders are all accurate and complete.   Carigan Lister, Dionne Bucy, MD, MPH [facility]

## 2021-03-16 ENCOUNTER — Other Ambulatory Visit: Payer: Self-pay | Admitting: Family Medicine

## 2021-03-18 ENCOUNTER — Other Ambulatory Visit: Payer: Self-pay | Admitting: Family Medicine

## 2021-03-18 DIAGNOSIS — J42 Unspecified chronic bronchitis: Secondary | ICD-10-CM

## 2021-03-18 NOTE — Telephone Encounter (Signed)
Requested Prescriptions  Pending Prescriptions Disp Refills   FLOVENT DISKUS 100 MCG/ACT AEPB [Pharmacy Med Name: [place] 100 MCG DISKUS] 180 each 0    Sig: INHALE 2 PUFFS BY MOUTH INTO THE LUNGS DAILY     Pulmonology:  Corticosteroids Failed - 03/18/2021  1:06 AM      Failed - Valid encounter within last 12 months    Recent Outpatient Visits          1 week ago Viral URI with cough   [facility] [place], Dionne Bucy, MD   7 months ago [place], Donald E, MD   1 year ago Adult hypothyroidism   [facility] Birdie Sons, MD   1 year ago Elevated brain natriuretic peptide (BNP) level   [facility] Birdie Sons, MD   2 years ago Epigastric abdominal pain   [facility] Birdie Sons, MD

## 2021-06-09 DIAGNOSIS — D2262 Melanocytic nevi of left upper limb, including shoulder: Secondary | ICD-10-CM | POA: Diagnosis not present

## 2021-06-09 DIAGNOSIS — D225 Melanocytic nevi of trunk: Secondary | ICD-10-CM | POA: Diagnosis not present

## 2021-06-09 DIAGNOSIS — L570 Actinic keratosis: Secondary | ICD-10-CM | POA: Diagnosis not present

## 2021-06-09 DIAGNOSIS — Z85828 Personal history of other malignant neoplasm of skin: Secondary | ICD-10-CM | POA: Diagnosis not present

## 2021-06-09 DIAGNOSIS — L821 Other seborrheic keratosis: Secondary | ICD-10-CM | POA: Diagnosis not present

## 2021-07-13 DIAGNOSIS — H40053 Ocular hypertension, bilateral: Secondary | ICD-10-CM | POA: Diagnosis not present

## 2021-07-20 DIAGNOSIS — H40053 Ocular hypertension, bilateral: Secondary | ICD-10-CM | POA: Diagnosis not present

## 2021-07-20 DIAGNOSIS — H40033 Anatomical narrow angle, bilateral: Secondary | ICD-10-CM | POA: Diagnosis not present

## 2021-07-20 DIAGNOSIS — H2513 Age-related nuclear cataract, bilateral: Secondary | ICD-10-CM | POA: Diagnosis not present

## 2021-08-10 ENCOUNTER — Other Ambulatory Visit: Payer: Self-pay | Admitting: Family Medicine

## 2021-08-10 DIAGNOSIS — E039 Hypothyroidism, unspecified: Secondary | ICD-10-CM

## 2021-08-31 ENCOUNTER — Ambulatory Visit
Admission: RE | Admit: 2021-08-31 | Discharge: 2021-08-31 | Disposition: A | Discharge status: Home / Self Care | Payer: Medicare Other | Attending: Family Medicine | Admitting: Family Medicine

## 2021-08-31 ENCOUNTER — Ambulatory Visit
Admission: RE | Admit: 2021-08-31 | Discharge: 2021-08-31 | Disposition: A | Discharge status: Home / Self Care | Payer: Medicare Other | Source: Ambulatory Visit | Attending: Family Medicine | Admitting: Family Medicine

## 2021-08-31 ENCOUNTER — Ambulatory Visit (INDEPENDENT_AMBULATORY_CARE_PROVIDER_SITE_OTHER): Payer: Medicare Other | Admitting: Family Medicine

## 2021-08-31 ENCOUNTER — Encounter: Payer: Self-pay | Admitting: Family Medicine

## 2021-08-31 VITALS — BP 131/60 | HR 66 | Temp 98.2°F | Resp 16 | Wt 154.6 lb

## 2021-08-31 DIAGNOSIS — E039 Hypothyroidism, unspecified: Secondary | ICD-10-CM

## 2021-08-31 DIAGNOSIS — R1084 Generalized abdominal pain: Secondary | ICD-10-CM | POA: Diagnosis not present

## 2021-08-31 DIAGNOSIS — K5900 Constipation, unspecified: Secondary | ICD-10-CM | POA: Diagnosis not present

## 2021-08-31 DIAGNOSIS — J42 Unspecified chronic bronchitis: Secondary | ICD-10-CM

## 2021-08-31 DIAGNOSIS — G8929 Other chronic pain: Secondary | ICD-10-CM | POA: Diagnosis not present

## 2021-08-31 DIAGNOSIS — M549 Dorsalgia, unspecified: Secondary | ICD-10-CM

## 2021-08-31 DIAGNOSIS — R109 Unspecified abdominal pain: Secondary | ICD-10-CM | POA: Diagnosis not present

## 2021-08-31 NOTE — Progress Notes (Unsigned)
I,Jana Robinson,acting as a scribe for Lelon Huh, MD.,have documented all relevant documentation on the behalf of Lelon Huh, MD,as directed by  Lelon Huh, MD while in the presence of Lelon Huh, MD.   Established patient visit   Patient: Jesse Sandoval   DOB: 01-23-1932   88 y.o. Male  MRN: [id number] Visit Date: 08/31/2021  Today's healthcare provider: Lelon Huh, MD   Chief Complaint  Patient presents with   GI Problem   Subjective    Patient presents for stomach feeling sore. Onset 2 years ago and consistent especially a couple hours after eating.  Thinks it could be a hernia "or something worse."  Denies any constipation or diarrhea. Requesting GI imaging.   Also states he has not had lab work done recently and is requesting. He does have history of h. Pylori successfully treated in 2020.   He is also due to follow up on hypothyroid, feels well on current dose of levothyroxine.   Medications: Outpatient Medications Prior to Visit  Medication Sig   acetaminophen (TYLENOL) 500 MG tablet Take 500 mg by mouth every 6 (six) hours as needed.   albuterol (VENTOLIN HFA) 108 (90 Base) MCG/ACT inhaler Inhale 2 puffs into the lungs every 6 (six) hours as needed for wheezing or shortness of breath.   famotidine (PEPCID) 20 MG tablet Take 20 mg by mouth 2 (two) times daily as needed for heartburn or indigestion.   FLOVENT DISKUS 100 MCG/ACT AEPB INHALE 2 PUFFS BY MOUTH INTO THE LUNGS DAILY   latanoprost (XALATAN) 0.005 % ophthalmic solution Place 1 drop into both eyes daily.   levothyroxine (SYNTHROID) 50 MCG tablet TAKE 1 TABLET BY MOUTH EVERY DAY   montelukast (SINGULAIR) 10 MG tablet TAKE 1 TABLET BY MOUTH EVERY DAY   benzonatate (TESSALON) 100 MG capsule Take 1 capsule (100 mg total) by mouth 2 (two) times daily as needed for cough.   mesalamine (CANASA) 1000 MG suppository Place 1,000 mg rectally as needed.    No facility-administered medications prior to visit.       {Labs  Heme  Chem  Endocrine  Serology  Results Review (optional):23779}   Objective    BP 131/60 (BP Location: Right Arm, Patient Position: Sitting, Cuff Size: Normal)   Pulse 66   Temp 98.2 F (36.8 C) (Oral)   Resp 16   Wt 154 lb 9.6 oz (70.1 kg)   SpO2 100%   BMI 21.56 kg/m  {Show previous vital signs (optional):23777}  Physical Exam   General Appearance:    Well developed, well nourished male, alert, cooperative, in no acute distress  Eyes:    PERRL, conjunctiva/corneas clear, EOM's intact       Lungs:     Clear to auscultation bilaterally, respirations unlabored  Heart:    Normal heart rate. Normal rhythm. No murmurs, rubs, or gallops.    Abdomen:   {Exam; abdomen:31541}. {Exam; abdomen 2:17754}        Assessment & Plan     1. Generalized abdominal pain He does have history of h. Pylori treated in 2020. Will check following labs.   - CBC - Comprehensive metabolic panel - Lipase - H. pylori breath test - DG Abd 2 Views; Future  2. Adult hypothyroidism  - T4, free - TSH  3.  Chronic midline back pain, unspecified back location Continues to have chronic pain, but no radicular symptoms. Last imaging was 2015      The entirety of the information documented  in the History of Present Illness, Review of Systems and Physical Exam were personally obtained by me. Portions of this information were initially documented by the CMA and reviewed by me for thoroughness and accuracy.     Lelon Huh, MD  [facility] [phone] (phone) [phone] (fax)  [place]

## 2021-09-01 LAB — T4, FREE: Free T4: 1.06 ng/dL (ref 0.82–1.77)

## 2021-09-01 LAB — CBC
Hematocrit: 34.5 % — ABNORMAL LOW (ref 37.5–51.0)
Hemoglobin: 11.4 g/dL — ABNORMAL LOW (ref 13.0–17.7)
MCH: 30.6 pg (ref 26.6–33.0)
MCHC: 33 g/dL (ref 31.5–35.7)
MCV: 93 fL (ref 79–97)
Platelets: 213 x10E3/uL (ref 150–450)
RBC: 3.73 x10E6/uL — ABNORMAL LOW (ref 4.14–5.80)
RDW: 13.1 % (ref 11.6–15.4)
WBC: 5.9 x10E3/uL (ref 3.4–10.8)

## 2021-09-01 LAB — COMPREHENSIVE METABOLIC PANEL
ALT: 14 IU/L (ref 0–44)
AST: 22 IU/L (ref 0–40)
Albumin/Globulin Ratio: 1.6 (ref 1.2–2.2)
Albumin: 3.9 g/dL (ref 3.6–4.6)
Alkaline Phosphatase: 106 IU/L (ref 44–121)
BUN/Creatinine Ratio: 17 (ref 10–24)
BUN: 19 mg/dL (ref 8–27)
Bilirubin Total: <0.2 mg/dL (ref 0.0–1.2)
CO2: 25 mmol/L (ref 20–29)
Calcium: 9.2 mg/dL (ref 8.6–10.2)
Chloride: 106 mmol/L (ref 96–106)
Creatinine, Ser: 1.09 mg/dL (ref 0.76–1.27)
Globulin, Total: 2.5 g/dL (ref 1.5–4.5)
Glucose: 108 mg/dL — ABNORMAL HIGH (ref 70–99)
Potassium: 4.3 mmol/L (ref 3.5–5.2)
Sodium: 144 mmol/L (ref 134–144)
Total Protein: 6.4 g/dL (ref 6.0–8.5)
eGFR: 66 mL/min/{1.73_m2} (ref >59)

## 2021-09-01 LAB — LIPASE: Lipase: 24 U/L (ref 13–78)

## 2021-09-01 LAB — TSH: TSH: 4.15 u[IU]/mL (ref 0.450–4.500)

## 2021-09-01 MED ORDER — FLOVENT DISKUS 100 MCG/ACT IN AEPB: 2.0000 | INHALATION_SPRAY | Freq: Every day | RESPIRATORY_TRACT | 4 refills | Status: DC

## 2021-09-01 MED ORDER — LEVOTHYROXINE SODIUM 50 MCG PO TABS: 50.0000 ug | ORAL_TABLET | Freq: Every day | ORAL | 4 refills | Status: DC

## 2021-09-08 ENCOUNTER — Telehealth: Payer: Self-pay

## 2021-09-08 NOTE — Telephone Encounter (Signed)
Yes, he needs the h. Pylori breath test. That was ordered.

## 2021-09-08 NOTE — Telephone Encounter (Signed)
Patient is requesting results from abdominal x ray done on 08/31/2021.Please review.  I noticed that H. Pylori breath test results are not in. I called patient to ask if he had this test done. Patient states that they did not do the H. Pylori breath test, they only did a blood draw. Please advise if you would like patient to return to the lab to have the H. Pylori breath test done. Patient is still having burning pain in his stomach 1-2 hours after eating. He has been taking Pepcid at night which seems to help.

## 2021-09-08 NOTE — Telephone Encounter (Signed)
Copied from [place] [phone]. Topic: General - Other >> Sep 08, 2021  8:39 AM Leilani Able wrote: Pt had X-rays on 6/5 and has not been notified of results and not on file for NT to disclose fu [phone] pls fu with pt

## 2021-09-08 NOTE — Telephone Encounter (Signed)
Patient advised. He plans to stop by the lab tomorrow morning (09/09/2021) to have the H.Pylori breath test done. Lab requisition printed and placed over at [address].   Patient is requesting the results of his abdominal x ray. Report in chart awaiting review from provider. Please advise.

## 2021-09-09 DIAGNOSIS — R1084 Generalized abdominal pain: Secondary | ICD-10-CM | POA: Diagnosis not present

## 2021-09-10 LAB — H. PYLORI BREATH TEST: H pylori Breath Test: NEGATIVE

## 2021-09-23 ENCOUNTER — Telehealth: Payer: Self-pay

## 2021-09-23 NOTE — Telephone Encounter (Signed)
FYI   Copied from [place]. Topic: General - Other >> Sep 23, 2021  1:55 PM [place] B wrote: Reason for CRM: Patient called in states was told by Dr Caryn Section to try metumicil for 7-10 days. He has already done this and wants to try it for 2 more weeks to see how it does.

## 2021-10-09 ENCOUNTER — Telehealth: Payer: Self-pay

## 2021-10-09 NOTE — Telephone Encounter (Signed)
Copied from [place] [phone]. Topic: General - Other >> Oct 09, 2021 10:41 AM Oley Balm E wrote: Reason for CRM: Pt called to report that Metamucil has improved his symptoms slightly and that he wants to continue using this.

## 2021-10-28 DIAGNOSIS — H40053 Ocular hypertension, bilateral: Secondary | ICD-10-CM | POA: Diagnosis not present

## 2022-01-08 ENCOUNTER — Other Ambulatory Visit: Payer: Self-pay | Admitting: Family Medicine

## 2022-01-08 NOTE — Telephone Encounter (Signed)
Medication Refill - Medication: albuterol (VENTOLIN HFA) 108 (90 Base) MCG/ACT inhaler  Has the patient contacted their pharmacy? Yes.   To contact pcp  Preferred Pharmacy (with phone number or street name):  [id number] [address] [place]:  [phone] Fax:  [phone]     Has the patient been seen for an appointment in the last year OR does the patient have an upcoming appointment? Yes.     Agent: Please be advised that RX refills may take up to 3 business days. We ask that you follow-up with your pharmacy.

## 2022-01-08 NOTE — Telephone Encounter (Signed)
Requested medication (s) are due for refill today: expired medication  Requested medication (s) are on the active medication list: yes  Last refill:  05/14/19 #6.7 g 1 refills  Future visit scheduled: no  Notes to clinic: expired medication last ordered by Delman Kitten, MD. Do you want to order Rx?     Requested Prescriptions  Pending Prescriptions Disp Refills   albuterol (VENTOLIN HFA) 108 (90 Base) MCG/ACT inhaler 6.7 g 1    Sig: Inhale 2 puffs into the lungs every 6 (six) hours as needed for wheezing or shortness of breath.     Pulmonology:  Beta Agonists 2 Passed - 01/08/2022 11:21 AM      Passed - Last BP in normal range    BP Readings from Last 1 Encounters:  08/31/21 131/60         Passed - Last Heart Rate in normal range    Pulse Readings from Last 1 Encounters:  08/31/21 66         Passed - Valid encounter within last 12 months    Recent Outpatient Visits           4 months ago Generalized abdominal pain   [facility] Birdie Sons, MD   10 months ago Viral URI with cough   [facility] [place], Dionne Bucy, MD   1 year ago Santiago, Donald E, MD   2 years ago Adult hypothyroidism   [facility] Birdie Sons, MD   2 years ago Elevated brain natriuretic peptide (BNP) level   [facility] Birdie Sons, MD

## 2022-01-11 MED ORDER — ALBUTEROL SULFATE HFA 108 (90 BASE) MCG/ACT IN AERS
2.0000 | INHALATION_SPRAY | Freq: Four times a day (QID) | RESPIRATORY_TRACT | 1 refills | Status: DC | PRN
Start: 2022-01-11 — End: 2024-02-01

## 2022-01-27 DIAGNOSIS — Z23 Encounter for immunization: Secondary | ICD-10-CM | POA: Diagnosis not present

## 2022-02-08 ENCOUNTER — Telehealth: Payer: Self-pay

## 2022-02-08 NOTE — Telephone Encounter (Signed)
Copied from [place] [id number]. Topic: General - Other >> Feb 03, 2022  9:38 AM Jesse Sandoval wrote: Reason for CRM: The patient has been directed by their insurance provider to contact their PCP regarding their Fluticasone Propionate, Inhal, (FLOVENT DISKUS) 100 MCG/ACT AEPB [[id number]] prescription   The patient has been told that the medication will become non formulary next year and would like to discuss the steps needed for an exemption   Please contact the patient further when possible

## 2022-02-09 NOTE — Telephone Encounter (Signed)
Called patient to discuss, male picked up and said he is not available and hung up phone before I was able to explain why I was calling.

## 2022-02-09 NOTE — Telephone Encounter (Signed)
He can either check his insurance formulary to see what alternatives the cover, or we can wait until his first refill of the year, which will initiate a prior authorization when the pharmacy will be able to tell us what alternatives are covered.

## 2022-02-10 NOTE — Telephone Encounter (Signed)
Tried calling patient. No answer; phone just kept ringing. Unable to leave message.

## 2022-02-15 DIAGNOSIS — D225 Melanocytic nevi of trunk: Secondary | ICD-10-CM | POA: Diagnosis not present

## 2022-02-15 DIAGNOSIS — Z85828 Personal history of other malignant neoplasm of skin: Secondary | ICD-10-CM | POA: Diagnosis not present

## 2022-02-15 DIAGNOSIS — D2261 Melanocytic nevi of right upper limb, including shoulder: Secondary | ICD-10-CM | POA: Diagnosis not present

## 2022-02-15 DIAGNOSIS — X32XXXA Exposure to sunlight, initial encounter: Secondary | ICD-10-CM | POA: Diagnosis not present

## 2022-02-15 DIAGNOSIS — D2262 Melanocytic nevi of left upper limb, including shoulder: Secondary | ICD-10-CM | POA: Diagnosis not present

## 2022-02-15 DIAGNOSIS — L570 Actinic keratosis: Secondary | ICD-10-CM | POA: Diagnosis not present

## 2022-02-16 NOTE — Telephone Encounter (Signed)
I called and spoke with patient. Patient wants to stay on Flovent. He would like for [place] to try initiating a prior authorization the first of next year when he is due for a refill.

## 2022-02-16 NOTE — Telephone Encounter (Signed)
Pt called to follow up on the PA for Flovent. Pt states he would like to stay on Flovent.  His understanding is the doctor could do an exemption.pt aware you will call back later today to discuss.    Call back:  [phone]

## 2022-03-01 DIAGNOSIS — H2513 Age-related nuclear cataract, bilateral: Secondary | ICD-10-CM | POA: Diagnosis not present

## 2022-04-01 ENCOUNTER — Other Ambulatory Visit: Payer: Self-pay | Admitting: Family Medicine

## 2022-04-01 DIAGNOSIS — J42 Unspecified chronic bronchitis: Secondary | ICD-10-CM

## 2022-04-01 MED ORDER — FLOVENT DISKUS 100 MCG/ACT IN AEPB: 2.0000 | INHALATION_SPRAY | Freq: Every day | RESPIRATORY_TRACT | 4 refills | Status: DC

## 2022-04-06 ENCOUNTER — Telehealth: Payer: Self-pay | Admitting: Family Medicine

## 2022-04-06 NOTE — Telephone Encounter (Signed)
Pt has been taking Fluticasone Propionate, Inhal, (FLOVENT DISKUS) 100 MCG/ACT AEPB  for his asthma/ pts insurance company is no longer including this in his formulary / pt asked if Dr. Caryn Section can call [employer] and ask if they can include this in the formulary / pt states this inhaler works well for him / pt can't get anymore until

## 2022-05-31 ENCOUNTER — Ambulatory Visit: Payer: Self-pay

## 2022-05-31 NOTE — Telephone Encounter (Signed)
  Chief Complaint: Medication questions Symptoms: none Frequency:  Pertinent Negatives: Patient denies  Disposition: '[]'$ ED /'[]'$ Urgent Care (no appt availability in office) / '[]'$ Appointment(In office/virtual)/ '[]'$  [place] [facility]/ '[]'$ Home Care/ '[]'$ Refused Recommended Disposition /'[]'$ [place] [facility]/ '[x]'$  Follow-up with PCP Additional Notes: Pt states that Flovent is discontinued. He has a supply , but will need a replacement for this medication when he runs out. Insurance suggested the medication Arnuity as a alternative.PT would like to know if this be be a good alternative for him.   Pt also states that he was on Canasa for bowel issues. PT has not been on this medication for awhile, but thinks he needs it again. Pt is wondering if there is a generic version. This medication is quite expensive.   Please advise.    Summary: med management   Pt stated he would like to discuss with PCP medication Arnuity previously on Fluticasone Propionate, Inhal, (FLOVENT DISKUS) 100 MCG/ACT AEPB. Stated he isn't sure if he should start on an or if this is safe for him. Scheduled pt for first availability with Dr. Caryn Section on 03/22 per request. Pt declined to see any other provider and did mention that he still has Fluticasone Propionate, Inhal, (FLOVENT DISKUS) 100 MCG/ACT AEPB medication left he is just seeking clinical advice until his appointment.     Reason for Disposition  [1] Caller has NON-URGENT medicine question about med that PCP prescribed AND [2] triager unable to answer question  Answer Assessment - Initial Assessment Questions 1. NAME of MEDICINE: "What medicine(s) are you calling about?"     Flovent/ Arnuity and Canasa 2. QUESTION: "What is your question?" (e.g., double dose of medicine, side effect)     Can pt use Arnuity. Is there a generic for Canasa? 3. PRESCRIBER: "Who prescribed the medicine?" Reason: if prescribed by specialist, call should be referred to that group.     D.  Fisher 4. SYMPTOMS: "Do you have any symptoms?" If Yes, ask: "What symptoms are you having?"  "How bad are the symptoms (e.g., mild, moderate, severe)  Protocols used: Medication Question Call-A-AH

## 2022-06-17 NOTE — Progress Notes (Signed)
[place],acting as a scribe for Lelon Huh, MD.,have documented all relevant documentation on the behalf of Lelon Huh, MD,as directed by  Lelon Huh, MD while in the presence of Lelon Huh, MD.    Established patient visit   Patient: Jesse Sandoval   DOB: [birth year]   89 y.o. Male  MRN: [id number] Visit Date: 06/18/2022  Today's healthcare provider: Lelon Huh, MD   No chief complaint on file.  Subjective    HPI  Patient is an 89 year old male who presents to discuss change in his inhaler.  He states he has been using Flovent for 6 years for restrictive lung disease and chronic bronchitis. He has done very well with inhaler, but Flovent is being discontinued. He does have prescription for albuterol but rarely needs to take it. His insurance recommended  Arnuity Ellipta     Medications: Outpatient Medications Prior to Visit  Medication Sig   acetaminophen (TYLENOL) 500 MG tablet Take 500 mg by mouth every 6 (six) hours as needed.   albuterol (VENTOLIN HFA) 108 (90 Base) MCG/ACT inhaler Inhale 2 puffs into the lungs every 6 (six) hours as needed for wheezing or shortness of breath.   famotidine (PEPCID) 20 MG tablet Take 20 mg by mouth 2 (two) times daily as needed for heartburn or indigestion.   Fluticasone Propionate, Inhal, (FLOVENT DISKUS) 100 MCG/ACT AEPB Inhale 2 puffs into the lungs daily.   latanoprost (XALATAN) 0.005 % ophthalmic solution Place 1 drop into both eyes daily.   levothyroxine (SYNTHROID) 50 MCG tablet Take 1 tablet (50 mcg total) by mouth daily.   montelukast (SINGULAIR) 10 MG tablet TAKE 1 TABLET BY MOUTH EVERY DAY   mesalamine (CANASA) 1000 MG suppository Place 1,000 mg rectally as needed.  (Patient not taking: Reported on 06/18/2022)   [DISCONTINUED] benzonatate (TESSALON) 100 MG capsule Take 1 capsule (100 mg total) by mouth 2 (two) times daily as needed for cough.   No facility-administered medications prior to visit.    Review of  Systems  Constitutional:  Positive for fatigue.  Respiratory:  Negative for cough, shortness of breath and wheezing.   Cardiovascular:  Positive for leg swelling (right only). Negative for chest pain and palpitations.  Neurological:  Negative for dizziness, light-headedness and headaches.       Objective    BP (!) 156/62 (BP Location: Left Arm, Patient Position: Sitting, Cuff Size: Normal)   Pulse 60   Temp 98.4 F (36.9 C) (Oral)   Ht 5\' 11"  (1.803 m)   Wt 153 lb (69.4 kg)   SpO2 100%   BMI 21.34 kg/m   Vitals:   06/18/22 1313 06/18/22 1318  BP: (!) 156/62 129/64  Pulse: 60   Temp: 98.4 F (36.9 C)   SpO2: 100%    Physical Exam   General: Appearance:    Well developed, well nourished male in no acute distress  Eyes:    PERRL, conjunctiva/corneas clear, EOM's intact       Lungs:     Clear to auscultation bilaterally, respirations unlabored  Heart:    Normal heart rate. Normal rhythm. No murmurs, rubs, or gallops.    MS:   All extremities are intact.    Neurologic:   Awake, alert, oriented x 3. No apparent focal neurological defect.         Assessment & Plan     1. Restrictive lung disease Change Flovent to Fluticasone Furoate (ARNUITY ELLIPTA) 100 MCG/ACT AEPB; Inhale 1 puff into  the lungs daily.  Dispense: 30 each; Refill: 12 due to discontinuation of Floevent  2. Colitis He had been on mesalamine for many years, but stopped about 3 years ago and reports he is having more gas. I doubt the two are related, but advised he can call for prescription if he decides to start back on mesalamine.       The entirety of the information documented in the History of Present Illness, Review of Systems and Physical Exam were personally obtained by me. Portions of this information were initially documented by the CMA and reviewed by me for thoroughness and accuracy.     Lelon Huh, MD  [place] [phone] (phone) [phone] (fax)  [place]

## 2022-06-18 ENCOUNTER — Ambulatory Visit (INDEPENDENT_AMBULATORY_CARE_PROVIDER_SITE_OTHER): Payer: Medicare Other | Admitting: Family Medicine

## 2022-06-18 VITALS — BP 129/64 | HR 60 | Temp 98.4°F | Ht 71.0 in | Wt 153.0 lb

## 2022-06-18 DIAGNOSIS — K529 Noninfective gastroenteritis and colitis, unspecified: Secondary | ICD-10-CM | POA: Diagnosis not present

## 2022-06-18 DIAGNOSIS — K219 Gastro-esophageal reflux disease without esophagitis: Secondary | ICD-10-CM

## 2022-06-18 DIAGNOSIS — J984 Other disorders of lung: Secondary | ICD-10-CM | POA: Diagnosis not present

## 2022-06-18 MED ORDER — ARNUITY ELLIPTA 100 MCG/ACT IN AEPB: 1.0000 | INHALATION_SPRAY | Freq: Every day | RESPIRATORY_TRACT | 12 refills | Status: DC

## 2022-09-01 DIAGNOSIS — H4010X1 Unspecified open-angle glaucoma, mild stage: Secondary | ICD-10-CM | POA: Diagnosis not present

## 2022-09-01 DIAGNOSIS — H4010X0 Unspecified open-angle glaucoma, stage unspecified: Secondary | ICD-10-CM | POA: Diagnosis not present

## 2022-09-02 DIAGNOSIS — H4020X1 Unspecified primary angle-closure glaucoma, mild stage: Secondary | ICD-10-CM | POA: Diagnosis not present

## 2022-09-02 DIAGNOSIS — H2513 Age-related nuclear cataract, bilateral: Secondary | ICD-10-CM | POA: Diagnosis not present

## 2022-09-21 DIAGNOSIS — H2511 Age-related nuclear cataract, right eye: Secondary | ICD-10-CM | POA: Diagnosis not present

## 2022-09-21 DIAGNOSIS — H2512 Age-related nuclear cataract, left eye: Secondary | ICD-10-CM | POA: Diagnosis not present

## 2022-09-24 ENCOUNTER — Encounter: Payer: Self-pay | Admitting: Ophthalmology

## 2022-09-24 NOTE — Anesthesia Preprocedure Evaluation (Signed)
Anesthesia Evaluation  Patient identified by MRN, date of birth, ID band Patient awake    Reviewed: Allergy & Precautions, H&P , NPO status , Patient's Chart, lab work & pertinent test results  Airway Mallampati: II  TM Distance: >3 FB Neck ROM: Full    Dental  (+) Poor Dentition, Chipped No loose teeth:   Pulmonary neg pulmonary ROS, asthma    Pulmonary exam normal breath sounds clear to auscultation       Cardiovascular Normal cardiovascular exam+ dysrhythmias  Rhythm:Regular Rate:Normal  Hx sinus bradycardia, hx first degree AV block   Neuro/Psych negative neurological ROS  negative psych ROS   GI/Hepatic Neg liver ROS, PUD,GERD  ,,  Endo/Other  Hypothyroidism    Renal/GU negative Renal ROS  negative genitourinary   Musculoskeletal negative musculoskeletal ROS (+) Arthritis ,    Abdominal   Peds negative pediatric ROS (+)  Hematology negative hematology ROS (+)   Anesthesia Other Findings Skin cancer  Prostate enlargement Ulcerative proctitis (HCC)  Diverticulosis Prostatitis  Barrett's esophagus Glaucoma  History of colon polyps Colitis  Temporomandibular joint disorders Asthma  Arthritis GERD (gastroesophageal reflux disease)     Reproductive/Obstetrics negative OB ROS                             Anesthesia Physical Anesthesia Plan  ASA: 3  Anesthesia Plan:    Post-op Pain Management:    Induction:   PONV Risk Score and Plan:   Airway Management Planned:   Additional Equipment:   Intra-op Plan:   Post-operative Plan:   Informed Consent:   Plan Discussed with:   Anesthesia Plan Comments:        Anesthesia Quick Evaluation

## 2022-09-24 NOTE — Discharge Instructions (Signed)

## 2022-09-29 ENCOUNTER — Encounter: Payer: Self-pay | Admitting: Ophthalmology

## 2022-09-29 ENCOUNTER — Other Ambulatory Visit: Payer: Self-pay

## 2022-09-29 ENCOUNTER — Ambulatory Visit: Payer: Medicare Other | Admitting: Anesthesiology

## 2022-09-29 ENCOUNTER — Encounter
Admission: RE | Disposition: A | Discharge status: Home / Self Care | Payer: Self-pay | Source: Home / Self Care | Attending: Ophthalmology

## 2022-09-29 ENCOUNTER — Ambulatory Visit
Admission: RE | Admit: 2022-09-29 | Discharge: 2022-09-29 | Disposition: A | Discharge status: Home / Self Care | Payer: Medicare Other | Attending: Ophthalmology | Admitting: Ophthalmology

## 2022-09-29 DIAGNOSIS — J449 Chronic obstructive pulmonary disease, unspecified: Secondary | ICD-10-CM | POA: Insufficient documentation

## 2022-09-29 DIAGNOSIS — K219 Gastro-esophageal reflux disease without esophagitis: Secondary | ICD-10-CM | POA: Insufficient documentation

## 2022-09-29 DIAGNOSIS — E039 Hypothyroidism, unspecified: Secondary | ICD-10-CM | POA: Diagnosis not present

## 2022-09-29 DIAGNOSIS — H2511 Age-related nuclear cataract, right eye: Secondary | ICD-10-CM | POA: Diagnosis not present

## 2022-09-29 DIAGNOSIS — H2512 Age-related nuclear cataract, left eye: Secondary | ICD-10-CM | POA: Insufficient documentation

## 2022-09-29 DIAGNOSIS — H4020X1 Unspecified primary angle-closure glaucoma, mild stage: Secondary | ICD-10-CM | POA: Diagnosis not present

## 2022-09-29 HISTORY — PX: CATARACT EXTRACTION W/PHACO: SHX586

## 2022-09-29 HISTORY — DX: Hypothyroidism, unspecified: E03.9

## 2022-09-29 HISTORY — DX: Unspecified chronic bronchitis: J42

## 2022-09-29 HISTORY — DX: Gastro-esophageal reflux disease without esophagitis: K21.9

## 2022-09-29 SURGERY — PHACOEMULSIFICATION, CATARACT, WITH IOL INSERTION
Anesthesia: Topical | Site: Eye | Laterality: Left

## 2022-09-29 MED ORDER — BRIMONIDINE TARTRATE-TIMOLOL 0.2-0.5 % OP SOLN
OPHTHALMIC | Status: DC | PRN
  Administered 2022-09-29: 1 [drp] via OPHTHALMIC

## 2022-09-29 MED ORDER — SIGHTPATH DOSE#1 BSS IO SOLN
INTRAOCULAR | Status: DC | PRN
  Administered 2022-09-29: 89 mL via OPHTHALMIC

## 2022-09-29 MED ORDER — MOXIFLOXACIN HCL 0.5 % OP SOLN
OPHTHALMIC | Status: DC | PRN
  Administered 2022-09-29: .2 mL via OPHTHALMIC

## 2022-09-29 MED ORDER — MIDAZOLAM HCL 2 MG/2ML IJ SOLN
INTRAMUSCULAR | Status: DC | PRN
  Administered 2022-09-29 (×2): 1 mg via INTRAVENOUS

## 2022-09-29 MED ORDER — SIGHTPATH DOSE#1 BSS IO SOLN
INTRAOCULAR | Status: DC | PRN
  Administered 2022-09-29: 1 mL via INTRAMUSCULAR

## 2022-09-29 MED ORDER — TETRACAINE HCL 0.5 % OP SOLN
1.0000 [drp] | OPHTHALMIC | Status: DC | PRN
  Administered 2022-09-29 (×3): 1 [drp] via OPHTHALMIC

## 2022-09-29 MED ORDER — SIGHTPATH DOSE#1 NA HYALUR & NA CHOND-NA HYALUR IO KIT
PACK | INTRAOCULAR | Status: DC | PRN
  Administered 2022-09-29: 1 via OPHTHALMIC

## 2022-09-29 MED ORDER — FENTANYL CITRATE (PF) 100 MCG/2ML IJ SOLN
INTRAMUSCULAR | Status: DC | PRN
  Administered 2022-09-29 (×2): 50 ug via INTRAVENOUS

## 2022-09-29 MED ORDER — LACTATED RINGERS IV SOLN: INTRAVENOUS | Status: DC

## 2022-09-29 MED ORDER — ARMC OPHTHALMIC DILATING DROPS
1.0000 | OPHTHALMIC | Status: DC | PRN
  Administered 2022-09-29 (×3): 1 via OPHTHALMIC

## 2022-09-29 MED ORDER — SIGHTPATH DOSE#1 BSS IO SOLN
INTRAOCULAR | Status: DC | PRN
  Administered 2022-09-29: 15 mL

## 2022-09-29 SURGICAL SUPPLY — 9 items
CATARACT SUITE SIGHTPATH (MISCELLANEOUS) ×1 IMPLANT
FEE CATARACT SUITE SIGHTPATH (MISCELLANEOUS) ×2 IMPLANT
GLOVE SRG 8 PF TXTR STRL LF DI (GLOVE) ×2 IMPLANT
GLOVE SURG ENC TEXT LTX SZ7.5 (GLOVE) ×2 IMPLANT
GLOVE SURG UNDER POLY LF SZ8 (GLOVE) ×1
LENS IOL TECNIS EYHANCE 20.5 (Intraocular Lens) IMPLANT
NDL FILTER BLUNT 18X1 1/2 (NEEDLE) ×2 IMPLANT
NEEDLE FILTER BLUNT 18X1 1/2 (NEEDLE) ×1 IMPLANT
SYR 3ML LL SCALE MARK (SYRINGE) ×2 IMPLANT

## 2022-09-29 NOTE — Anesthesia Postprocedure Evaluation (Signed)
Anesthesia Post Note  Patient: Nikita R Benecke  Procedure(s) Performed: CATARACT EXTRACTION PHACO AND INTRAOCULAR LENS PLACEMENT (IOC) LEFT  23.20  01:51.2 (Left: Eye)  Patient location during evaluation: PACU Anesthesia Type: MAC Level of consciousness: awake and alert Pain management: pain level controlled Vital Signs Assessment: post-procedure vital signs reviewed and stable Respiratory status: spontaneous breathing, nonlabored ventilation, respiratory function stable and patient connected to nasal cannula oxygen Cardiovascular status: stable and blood pressure returned to baseline Postop Assessment: no apparent nausea or vomiting Anesthetic complications: no   No notable events documented.   Last Vitals:  Vitals:   09/29/22 1115 09/29/22 1120  BP: 131/67 135/64  Pulse: (!) 58 (!) 59  Resp: 12 14  Temp: (!) 36.1 C (!) 36.1 C  SpO2: 99% 98%    Last Pain:  Vitals:   09/29/22 1120  TempSrc:   PainSc: 0-No pain                 Anwyn Kriegel C Nakia Remmers

## 2022-09-29 NOTE — Transfer of Care (Signed)
Immediate Anesthesia Transfer of Care Note  Patient: Jesse Sandoval  Procedure(s) Performed: CATARACT EXTRACTION PHACO AND INTRAOCULAR LENS PLACEMENT (IOC) LEFT  23.20  01:51.2 (Left: Eye)  Patient Location: PACU  Anesthesia Type: No value filed.  Level of Consciousness: awake, alert  and patient cooperative  Airway and Oxygen Therapy: Patient Spontanous Breathing and Patient connected to supplemental oxygen  Post-op Assessment: Post-op Vital signs reviewed, Patient's Cardiovascular Status Stable, Respiratory Function Stable, Patent Airway and No signs of Nausea or vomiting  Post-op Vital Signs: Reviewed and stable  Complications: No notable events documented.

## 2022-09-29 NOTE — H&P (Signed)
[facility]   Primary Care Physician:  Malva Limes, MD Ophthalmologist: Dr. Lockie Mola  Pre-Procedure History & Physical: HPI:  Jesse Sandoval is a 89 y.o. male here for ophthalmic surgery.   Past Medical History:  Diagnosis Date   Adult hypothyroidism    Arthritis    Asthma    Barrett's esophagus    Chronic bronchitis (HCC)    Colitis 06/26/2013   Diverticulosis    GERD (gastroesophageal reflux disease)    Glaucoma    History of colon polyps    Prostate enlargement    Prostatitis    Skin cancer    Temporomandibular joint disorders 08/15/2015   Ulcerative proctitis (HCC)     Past Surgical History:  Procedure Laterality Date   COLONOSCOPY WITH PROPOFOL N/A 01/03/2015   Procedure: COLONOSCOPY WITH PROPOFOL;  Surgeon: Scot Jun, MD;  Location: [facility] ENDOSCOPY;  Service: Endoscopy;  Laterality: N/A;   CYSTECTOMY     chest area on the right   EXTERNAL EAR SURGERY     HAND SURGERY     HERNIA REPAIR     x3   HERNIA REPAIR  [phone]   PROSTATE SURGERY     SEPTOPLASTY      Prior to Admission medications   Medication Sig Start Date End Date Taking? Authorizing Provider  albuterol (VENTOLIN HFA) 108 (90 Base) MCG/ACT inhaler Inhale 2 puffs into the lungs every 6 (six) hours as needed for wheezing or shortness of breath. 01/11/22  Yes Malva Limes, MD  famotidine (PEPCID) 20 MG tablet Take 20 mg by mouth daily.   Yes [provider]  Fluticasone Furoate (ARNUITY ELLIPTA) 100 MCG/ACT AEPB Inhale 1 puff into the lungs daily. 06/18/22  Yes Malva Limes, MD  latanoprost (XALATAN) 0.005 % ophthalmic solution Place 1 drop into both eyes daily. 06/03/20  Yes [provider]  levothyroxine (SYNTHROID) 50 MCG tablet Take 1 tablet (50 mcg total) by mouth daily. 09/01/21  Yes Malva Limes, MD  montelukast (SINGULAIR) 10 MG tablet TAKE 1 TABLET BY MOUTH EVERY DAY 01/08/22  Yes Malva Limes, MD  acetaminophen (TYLENOL) 500 MG tablet  Take 500 mg by mouth every 6 (six) hours as needed.    [provider]    Allergies as of 09/06/2022 - Review Complete 06/18/2022  Allergen Reaction Noted   Codeine  06/25/2013   Penicillins  06/25/2013   Sulfa antibiotics  06/25/2013    Family History  Problem Relation Age of Onset   Heart disease Mother    Heart failure Mother    Hypertension Mother    Cancer Father        pancreatic    Social History   Socioeconomic History   Marital status: Married    Spouse name: Not on file   Number of children: 0   Years of education: Not on file   Highest education level: Not on file  Occupational History   Occupation: Retired  Tobacco Use   Smoking status: Never   Smokeless tobacco: Never  Vaping Use   Vaping Use: Never used  Substance and Sexual Activity   Alcohol use: No   Drug use: No   Sexual activity: Not on file  Other Topics Concern   Not on file  Social History Narrative   Not on file   Social Determinants of Health   Financial Resource Strain: Not on file  Food Insecurity: Not on file  Transportation Needs: Not on file  Physical  Activity: Not on file  Stress: Not on file  Social Connections: Not on file  Intimate Partner Violence: Not on file    Review of Systems: See HPI, otherwise negative ROS  Physical Exam: BP (!) 159/70   Temp 97.7 F (36.5 C) (Temporal)   Resp 13   Ht 5' 10.98" (1.803 m)   Wt 67.6 kg   SpO2 99%   BMI 20.80 kg/m  General:   Alert,  pleasant and cooperative in NAD Head:  Normocephalic and atraumatic. Lungs:  Clear to auscultation.    Heart:  Regular rate and rhythm.   Impression/Plan: Jesse Sandoval is here for ophthalmic surgery.  Risks, benefits, limitations, and alternatives regarding ophthalmic surgery have been reviewed with the patient.  Questions have been answered.  All parties agreeable.   Lockie Mola, MD  09/29/2022, 10:05 AM

## 2022-09-29 NOTE — Op Note (Signed)
  OPERATIVE NOTE  CLEOPHUS WILLS [id number] 09/29/2022   PREOPERATIVE DIAGNOSIS:  Nuclear sclerotic cataract left eye. H25.12   POSTOPERATIVE DIAGNOSIS:    Nuclear sclerotic cataract left eye.     PROCEDURE:  Phacoemusification with posterior chamber intraocular lens placement of the left eye  Ultrasound time: Procedure(s): CATARACT EXTRACTION PHACO AND INTRAOCULAR LENS PLACEMENT (IOC) LEFT  23.20  01:51.2 (Left)  LENS:   Implant Name Type Inv. Item Serial No. Manufacturer Lot No. LRB No. Used Action  LENS IOL TECNIS EYHANCE 20.5 - [id number] Intraocular Lens LENS IOL TECNIS EYHANCE 20.5 [id number] [employer]  Left 1 Implanted      SURGEON:  Deirdre Evener, MD   ANESTHESIA:  Topical with tetracaine drops and 2% Xylocaine jelly, augmented with 1% preservative-free intracameral lidocaine.    COMPLICATIONS:  None.   DESCRIPTION OF PROCEDURE:  The patient was identified in the holding room and transported to the operating room and placed in the supine position under the operating microscope.  The left eye was identified as the operative eye and it was prepped and draped in the usual sterile ophthalmic fashion.   A 1 millimeter clear-corneal paracentesis was made at the 1:30 position.  0.5 ml of preservative-free 1% lidocaine was injected into the anterior chamber.  The anterior chamber was filled with Viscoat viscoelastic.  A 2.4 millimeter keratome was used to make a near-clear corneal incision at the 10:30 position.  .  A curvilinear capsulorrhexis was made with a cystotome and capsulorrhexis forceps.  Balanced salt solution was used to hydrodissect and hydrodelineate the nucleus.   Phacoemulsification was then used in stop and chop fashion to remove the lens nucleus and epinucleus.  The remaining cortex was then removed using the irrigation and aspiration handpiece. Provisc was then placed into the capsular bag to distend it for lens placement.  A lens was then injected into the  capsular bag.  The remaining viscoelastic was aspirated.   Wounds were hydrated with balanced salt solution.  The anterior chamber was inflated to a physiologic pressure with balanced salt solution.  No wound leaks were noted. Vigamox 0.2 ml of a 1mg  per ml solution was injected into the anterior chamber for a dose of 0.2 mg of intracameral antibiotic at the completion of the case.   Timolol and Brimonidine drops were applied to the eye.  The patient was taken to the recovery room in stable condition without complications of anesthesia or surgery.  Romulus Hanrahan 09/29/2022, 11:13 AM

## 2022-10-05 ENCOUNTER — Encounter: Payer: Self-pay | Admitting: Ophthalmology

## 2022-10-08 NOTE — Anesthesia Preprocedure Evaluation (Addendum)
Anesthesia Evaluation  Patient identified by MRN, date of birth, ID band Patient awake    Reviewed: Allergy & Precautions, H&P , NPO status , Patient's Chart, lab work & pertinent test results  Airway Mallampati: II  TM Distance: >3 FB Neck ROM: Full    Dental no notable dental hx.    Pulmonary neg pulmonary ROS, asthma    Pulmonary exam normal breath sounds clear to auscultation       Cardiovascular negative cardio ROS Normal cardiovascular exam+ dysrhythmias  Rhythm:Regular Rate:Normal     Neuro/Psych negative neurological ROS  negative psych ROS   GI/Hepatic negative GI ROS, Neg liver ROS, PUD,GERD  ,,  Endo/Other  negative endocrine ROSHypothyroidism    Renal/GU negative Renal ROS  negative genitourinary   Musculoskeletal negative musculoskeletal ROS (+) Arthritis ,    Abdominal   Peds negative pediatric ROS (+)  Hematology negative hematology ROS (+)   Anesthesia Other Findings Previous cataract surgery 09-29-22  Skin cancer  Prostate enlargement Ulcerative proctitis   Diverticulosis Prostatitis  Barrett's esophagus Glaucoma  History of colon polyps Colitis  Temporomandibular joint disorders Asthma  Arthritis GERD (gastroesophageal reflux disease) Chronic bronchitis (HCC) Adult hypothyroidism     Reproductive/Obstetrics negative OB ROS                              Anesthesia Physical Anesthesia Plan  ASA: 3  Anesthesia Plan: MAC   Post-op Pain Management:    Induction: Intravenous  PONV Risk Score and Plan:   Airway Management Planned: Natural Airway and Nasal Cannula  Additional Equipment:   Intra-op Plan:   Post-operative Plan:   Informed Consent: I have reviewed the patients History and Physical, chart, labs and discussed the procedure including the risks, benefits and alternatives for the proposed anesthesia with the patient or authorized  representative who has indicated his/her understanding and acceptance.     Dental Advisory Given  Plan Discussed with: Anesthesiologist, CRNA and Surgeon  Anesthesia Plan Comments: (Patient consented for risks of anesthesia including but not limited to:  - adverse reactions to medications - damage to eyes, teeth, lips or other oral mucosa - nerve damage due to positioning  - sore throat or hoarseness - Damage to heart, brain, nerves, lungs, other parts of body or loss of life  Patient voiced understanding.)         Anesthesia Quick Evaluation

## 2022-10-11 NOTE — Discharge Instructions (Signed)

## 2022-10-13 ENCOUNTER — Encounter: Payer: Self-pay | Admitting: Ophthalmology

## 2022-10-13 ENCOUNTER — Ambulatory Visit
Admission: RE | Admit: 2022-10-13 | Discharge: 2022-10-13 | Disposition: A | Discharge status: Home / Self Care | Payer: Medicare Other | Attending: Ophthalmology | Admitting: Ophthalmology

## 2022-10-13 ENCOUNTER — Ambulatory Visit: Payer: Medicare Other | Admitting: Anesthesiology

## 2022-10-13 ENCOUNTER — Other Ambulatory Visit: Payer: Self-pay

## 2022-10-13 ENCOUNTER — Encounter
Admission: RE | Disposition: A | Discharge status: Home / Self Care | Payer: Self-pay | Source: Home / Self Care | Attending: Ophthalmology

## 2022-10-13 DIAGNOSIS — Z85828 Personal history of other malignant neoplasm of skin: Secondary | ICD-10-CM | POA: Diagnosis not present

## 2022-10-13 DIAGNOSIS — J45909 Unspecified asthma, uncomplicated: Secondary | ICD-10-CM | POA: Insufficient documentation

## 2022-10-13 DIAGNOSIS — H4020X1 Unspecified primary angle-closure glaucoma, mild stage: Secondary | ICD-10-CM | POA: Diagnosis not present

## 2022-10-13 DIAGNOSIS — H269 Unspecified cataract: Secondary | ICD-10-CM | POA: Diagnosis not present

## 2022-10-13 DIAGNOSIS — Z08 Encounter for follow-up examination after completed treatment for malignant neoplasm: Secondary | ICD-10-CM | POA: Insufficient documentation

## 2022-10-13 DIAGNOSIS — K219 Gastro-esophageal reflux disease without esophagitis: Secondary | ICD-10-CM | POA: Diagnosis not present

## 2022-10-13 DIAGNOSIS — H2511 Age-related nuclear cataract, right eye: Secondary | ICD-10-CM | POA: Diagnosis not present

## 2022-10-13 DIAGNOSIS — M1990 Unspecified osteoarthritis, unspecified site: Secondary | ICD-10-CM | POA: Insufficient documentation

## 2022-10-13 DIAGNOSIS — J42 Unspecified chronic bronchitis: Secondary | ICD-10-CM | POA: Insufficient documentation

## 2022-10-13 DIAGNOSIS — E039 Hypothyroidism, unspecified: Secondary | ICD-10-CM | POA: Diagnosis not present

## 2022-10-13 HISTORY — PX: CATARACT EXTRACTION W/PHACO: SHX586

## 2022-10-13 SURGERY — PHACOEMULSIFICATION, CATARACT, WITH IOL INSERTION
Anesthesia: Monitor Anesthesia Care | Laterality: Right

## 2022-10-13 MED ORDER — SIGHTPATH DOSE#1 BSS IO SOLN
INTRAOCULAR | Status: DC | PRN
  Administered 2022-10-13: 2 mL

## 2022-10-13 MED ORDER — SIGHTPATH DOSE#1 BSS IO SOLN
INTRAOCULAR | Status: DC | PRN
  Administered 2022-10-13: 111 mL via OPHTHALMIC

## 2022-10-13 MED ORDER — MOXIFLOXACIN HCL 0.5 % OP SOLN
OPHTHALMIC | Status: DC | PRN
  Administered 2022-10-13: .2 mL via OPHTHALMIC

## 2022-10-13 MED ORDER — MIDAZOLAM HCL 2 MG/2ML IJ SOLN
INTRAMUSCULAR | Status: DC | PRN
  Administered 2022-10-13: 2 mg via INTRAVENOUS

## 2022-10-13 MED ORDER — SIGHTPATH DOSE#1 BSS IO SOLN
INTRAOCULAR | Status: DC | PRN
  Administered 2022-10-13: 15 mL via INTRAOCULAR

## 2022-10-13 MED ORDER — TETRACAINE HCL 0.5 % OP SOLN
1.0000 [drp] | OPHTHALMIC | Status: DC | PRN
  Administered 2022-10-13 (×3): 1 [drp] via OPHTHALMIC

## 2022-10-13 MED ORDER — ARMC OPHTHALMIC DILATING DROPS
1.0000 | OPHTHALMIC | Status: DC | PRN
  Administered 2022-10-13 (×3): 1 via OPHTHALMIC

## 2022-10-13 MED ORDER — SIGHTPATH DOSE#1 NA HYALUR & NA CHOND-NA HYALUR IO KIT
PACK | INTRAOCULAR | Status: DC | PRN
  Administered 2022-10-13: 1 via OPHTHALMIC

## 2022-10-13 MED ORDER — BRIMONIDINE TARTRATE-TIMOLOL 0.2-0.5 % OP SOLN
OPHTHALMIC | Status: DC | PRN
  Administered 2022-10-13: 1 [drp] via OPHTHALMIC

## 2022-10-13 MED ORDER — LACTATED RINGERS IV SOLN: INTRAVENOUS | Status: DC

## 2022-10-13 SURGICAL SUPPLY — 9 items
CATARACT SUITE SIGHTPATH (MISCELLANEOUS) ×1 IMPLANT
FEE CATARACT SUITE SIGHTPATH (MISCELLANEOUS) ×2 IMPLANT
GLOVE SRG 8 PF TXTR STRL LF DI (GLOVE) ×2 IMPLANT
GLOVE SURG ENC TEXT LTX SZ7.5 (GLOVE) ×2 IMPLANT
GLOVE SURG UNDER POLY LF SZ8 (GLOVE) ×1
LENS IOL TECNIS EYHANCE 20.5 (Intraocular Lens) IMPLANT
NDL FILTER BLUNT 18X1 1/2 (NEEDLE) ×2 IMPLANT
NEEDLE FILTER BLUNT 18X1 1/2 (NEEDLE) ×1 IMPLANT
SYR 3ML LL SCALE MARK (SYRINGE) ×2 IMPLANT

## 2022-10-13 NOTE — H&P (Signed)
[facility]   Primary Care Physician:  Malva Limes, MD Ophthalmologist: Dr. Lockie Mola  Pre-Procedure History & Physical: HPI:  Jesse Sandoval is a 89 y.o. male here for ophthalmic surgery.   Past Medical History:  Diagnosis Date   Adult hypothyroidism    Arthritis    Asthma    Barrett's esophagus    Chronic bronchitis (HCC)    Colitis 06/26/2013   Diverticulosis    GERD (gastroesophageal reflux disease)    Glaucoma    History of colon polyps    Prostate enlargement    Prostatitis    Skin cancer    Temporomandibular joint disorders 08/15/2015   Ulcerative proctitis [facility])     Past Surgical History:  Procedure Laterality Date   CATARACT EXTRACTION W/PHACO Left 09/29/2022   Procedure: CATARACT EXTRACTION PHACO AND INTRAOCULAR LENS PLACEMENT (IOC) LEFT  23.20  01:51.2;  Surgeon: Lockie Mola, MD;  Location: [facility];  Service: Ophthalmology;  Laterality: Left;   COLONOSCOPY WITH PROPOFOL N/A 01/03/2015   Procedure: COLONOSCOPY WITH PROPOFOL;  Surgeon: Scot Jun, MD;  Location: [facility] ENDOSCOPY;  Service: Endoscopy;  Laterality: N/A;   CYSTECTOMY     chest area on the right   EXTERNAL EAR SURGERY     HAND SURGERY     HERNIA REPAIR     x3   HERNIA REPAIR  [phone]   PROSTATE SURGERY     SEPTOPLASTY      Prior to Admission medications   Medication Sig Start Date End Date Taking? Authorizing Provider  acetaminophen (TYLENOL) 500 MG tablet Take 500 mg by mouth every 6 (six) hours as needed.   Yes [provider]  albuterol (VENTOLIN HFA) 108 (90 Base) MCG/ACT inhaler Inhale 2 puffs into the lungs every 6 (six) hours as needed for wheezing or shortness of breath. 01/11/22  Yes Malva Limes, MD  famotidine (PEPCID) 20 MG tablet Take 20 mg by mouth daily.   Yes [provider]  Fluticasone Furoate (ARNUITY ELLIPTA) 100 MCG/ACT AEPB Inhale 1 puff into the lungs daily. 06/18/22  Yes Malva Limes, MD   latanoprost (XALATAN) 0.005 % ophthalmic solution Place 1 drop into both eyes daily. 06/03/20  Yes [provider]  levothyroxine (SYNTHROID) 50 MCG tablet Take 1 tablet (50 mcg total) by mouth daily. 09/01/21  Yes Malva Limes, MD  montelukast (SINGULAIR) 10 MG tablet TAKE 1 TABLET BY MOUTH EVERY DAY 01/08/22  Yes Malva Limes, MD    Allergies as of 09/06/2022 - Review Complete 06/18/2022  Allergen Reaction Noted   Codeine  06/25/2013   Penicillins  06/25/2013   Sulfa antibiotics  06/25/2013    Family History  Problem Relation Age of Onset   Heart disease Mother    Heart failure Mother    Hypertension Mother    Cancer Father        pancreatic    Social History   Socioeconomic History   Marital status: Married    Spouse name: Not on file   Number of children: 0   Years of education: Not on file   Highest education level: Not on file  Occupational History   Occupation: Retired  Tobacco Use   Smoking status: Never   Smokeless tobacco: Never  Vaping Use   Vaping status: Never Used  Substance and Sexual Activity   Alcohol use: No   Drug use: No   Sexual activity: Not on file  Other Topics Concern   Not  on file  Social History Narrative   Not on file   Social Determinants of Health   Financial Resource Strain: Not on file  Food Insecurity: Not on file  Transportation Needs: Not on file  Physical Activity: Not on file  Stress: Not on file  Social Connections: Not on file  Intimate Partner Violence: Not on file    Review of Systems: See HPI, otherwise negative ROS  Physical Exam: BP (!) 157/57   Pulse (!) 59   Temp 97.6 F (36.4 C) (Temporal)   Resp 18   Ht 5\' 10"  (1.778 m)   Wt 67.1 kg   SpO2 100%   BMI 21.24 kg/m  General:   Alert,  pleasant and cooperative in NAD Head:  Normocephalic and atraumatic. Lungs:  Clear to auscultation.    Heart:  Regular rate and rhythm.   Impression/Plan: Jesse Sandoval is here for ophthalmic  surgery.  Risks, benefits, limitations, and alternatives regarding ophthalmic surgery have been reviewed with the patient.  Questions have been answered.  All parties agreeable.   Lockie Mola, MD  10/13/2022, 12:37 PM

## 2022-10-13 NOTE — Transfer of Care (Signed)
Immediate Anesthesia Transfer of Care Note  Patient: Jesse Sandoval  Procedure(s) Performed: CATARACT EXTRACTION PHACO AND INTRAOCULAR LENS PLACEMENT (IOC) RIGHT 20.62 02:04.1 (Right)  Patient Location: PACU  Anesthesia Type: MAC  Level of Consciousness: awake, alert  and patient cooperative  Airway and Oxygen Therapy: Patient Spontanous Breathing and Patient connected to supplemental oxygen  Post-op Assessment: Post-op Vital signs reviewed, Patient's Cardiovascular Status Stable, Respiratory Function Stable, Patent Airway and No signs of Nausea or vomiting  Post-op Vital Signs: Reviewed and stable  Complications: No notable events documented.

## 2022-10-13 NOTE — Op Note (Signed)
LOCATION:  [facility]   PREOPERATIVE DIAGNOSIS:    Nuclear sclerotic cataract right eye. H25.11   POSTOPERATIVE DIAGNOSIS:  Nuclear sclerotic cataract right eye.     PROCEDURE:  Phacoemusification with posterior chamber intraocular lens placement of the right eye   ULTRASOUND TIME: Procedure(s): CATARACT EXTRACTION PHACO AND INTRAOCULAR LENS PLACEMENT (IOC) RIGHT 20.62 02:04.1 (Right)  LENS:   Implant Name Type Inv. Item Serial No. Manufacturer Lot No. LRB No. Used Action  LENS IOL TECNIS EYHANCE 20.5 - [id number] Intraocular Lens LENS IOL TECNIS EYHANCE 20.5 [id number] [employer]  Right 1 Implanted         SURGEON:  Deirdre Evener, MD   ANESTHESIA:  Topical with tetracaine drops and 2% Xylocaine jelly, augmented with 1% preservative-free intracameral lidocaine.    COMPLICATIONS:  None.   DESCRIPTION OF PROCEDURE:  The patient was identified in the holding room and transported to the operating room and placed in the supine position under the operating microscope.  The right eye was identified as the operative eye and it was prepped and draped in the usual sterile ophthalmic fashion.   A 1 millimeter clear-corneal paracentesis was made at the 12:00 position.  0.5 ml of preservative-free 1% lidocaine was injected into the anterior chamber. The anterior chamber was filled with Viscoat viscoelastic.  A 2.4 millimeter keratome was used to make a near-clear corneal incision at the 9:00 position.  A curvilinear capsulorrhexis was made with a cystotome and capsulorrhexis forceps.  Balanced salt solution was used to hydrodissect and hydrodelineate the nucleus.   Phacoemulsification was then used in stop and chop fashion to remove the lens nucleus and epinucleus.  The remaining cortex was then removed using the irrigation and aspiration handpiece. Provisc was then placed into the capsular bag to distend it for lens placement.  A lens was then injected into the capsular bag.  The  remaining viscoelastic was aspirated.   Wounds were hydrated with balanced salt solution.  The anterior chamber was inflated to a physiologic pressure with balanced salt solution.  No wound leaks were noted. Vigamox 0.2 ml of a 1mg  per ml solution was injected into the anterior chamber for a dose of 0.2 mg of intracameral antibiotic at the completion of the case.   Timolol and Brimonidine drops were applied to the eye.  The patient was taken to the recovery room in stable condition without complications of anesthesia or surgery.   Glendale Youngblood 10/13/2022, 2:19 PM

## 2022-10-14 ENCOUNTER — Encounter: Payer: Self-pay | Admitting: Ophthalmology

## 2022-10-14 NOTE — Anesthesia Postprocedure Evaluation (Signed)
Anesthesia Post Note  Patient: Keaton R Augsburger  Procedure(s) Performed: CATARACT EXTRACTION PHACO AND INTRAOCULAR LENS PLACEMENT (IOC) RIGHT 20.62 02:04.1 (Right)  Patient location during evaluation: PACU Anesthesia Type: MAC Level of consciousness: awake and alert Pain management: pain level controlled Vital Signs Assessment: post-procedure vital signs reviewed and stable Respiratory status: spontaneous breathing, nonlabored ventilation, respiratory function stable and patient connected to nasal cannula oxygen Cardiovascular status: stable and blood pressure returned to baseline Postop Assessment: no apparent nausea or vomiting Anesthetic complications: no   No notable events documented.   Last Vitals:  Vitals:   10/13/22 1428 10/13/22 1430  BP: 138/76 127/75  Pulse: 60 61  Resp: 17 12  Temp:    SpO2: 99% 99%    Last Pain:  Vitals:   10/13/22 1428  TempSrc:   PainSc: 0-No pain                 Nailani Full C Staisha Winiarski

## 2022-10-26 ENCOUNTER — Ambulatory Visit: Payer: Self-pay | Admitting: *Deleted

## 2022-10-26 NOTE — Telephone Encounter (Signed)
Summary: poss upper res?   Per agent" "Pt called with sore throat, congestion , cough///he says he knows it is upper resp.  He asked to see Dr. Sherrie Mustache but he does not have anything.  He is asking for an antibiotic"  CB# [phone]          Chief Complaint: Congestion, sore throat Symptoms: Mild dry cough, sore throat, some sinus drainage, headache, chills Frequency: Saturday night Pertinent Negatives: Patient denies SOB Disposition: [] ED /[] Urgent Care (no appt availability in office) / [] Appointment(In office/virtual)/ []  [facility]/ [] Home Care/ [x] Refused Recommended Disposition /[] [facility]/ []  Follow-up with PCP Additional Notes:  Pt calling requesting antibiotic. "I know it's an upper respiratory infection."  States sore throat worse symptom.  Advised would need appt for antibiotic. Pt states "I have these every year and Dr. Sherrie Mustache knows me."Declines first available appt with Robynn Pane tomorrow AM. "I'll like to be seen today by Dr. Sherrie Mustache, would only take a minute.He knows me well and I get this all the time."   Assured pt NT would route to practice for PCPs review and final disposition. Care advise provided, verbalizes understanding.   Reason for Disposition  [1] Continuous (nonstop) coughing interferes with work or school AND [2] no improvement using cough treatment per Care Advice  Answer Assessment - Initial Assessment Questions 1. ONSET: "When did the cough begin?"      Saturday night 2. SEVERITY: "How bad is the cough today?"      Mild 3. SPUTUM: "Describe the color of your sputum" (none, dry cough; clear, white, yellow, green)     Dry 4. HEMOPTYSIS: "Are you coughing up any blood?" If so ask: "How much?" (flecks, streaks, tablespoons, etc.)     No 5. DIFFICULTY BREATHING: "Are you having difficulty breathing?" If Yes, ask: "How bad is it?" (e.g., mild, moderate, severe)    - MILD: No SOB at rest, mild SOB with walking, speaks normally in sentences,  can lie down, no retractions, pulse < 100.    - MODERATE: SOB at rest, SOB with minimal exertion and prefers to sit, cannot lie down flat, speaks in phrases, mild retractions, audible wheezing, pulse 100-120.    - SEVERE: Very SOB at rest, speaks in single words, struggling to breathe, sitting hunched forward, retractions, pulse > 120      No SOB 6. FEVER: "Do you have a fever?" If Yes, ask: "What is your temperature, how was it measured, and when did it start?"     "Little chilled then warm later." 7. CARDIAC HISTORY: "Do you have any history of heart disease?" (e.g., heart attack, congestive heart failure)       8. LUNG HISTORY: "Do you have any history of lung disease?"  (e.g., pulmonary embolus, asthma, emphysema)  9. PE RISK FACTORS: "Do you have a history of blood clots?" (or: recent major surgery, recent prolonged travel, bedridden)     NA 10. OTHER SYMPTOMS: "Do you have any other symptoms?" (e.g., runny nose, wheezing, chest pain)       Sore throat, sinus drainage, headache mild, chest congestion  Protocols used: Cough - Acute Non-Productive-A-AH

## 2022-10-27 ENCOUNTER — Ambulatory Visit: Payer: Medicare Other | Admitting: Family Medicine

## 2022-10-27 DIAGNOSIS — J40 Bronchitis, not specified as acute or chronic: Secondary | ICD-10-CM

## 2022-10-27 MED ORDER — DOXYCYCLINE HYCLATE 100 MG PO CAPS
100.0000 mg | ORAL_CAPSULE | Freq: Two times a day (BID) | ORAL | 0 refills | Status: DC
Start: 2022-10-27 — End: 2023-07-08

## 2022-10-27 NOTE — Progress Notes (Signed)
Established patient visit   Patient: Jesse Sandoval   DOB: 14-Aug-1931   89 y.o. Male  MRN: [id number] Visit Date: 10/27/2022  Today's healthcare provider: Mila Merry, MD   Chief Complaint  Patient presents with   URI   Subjective    HPI  Discussed the use of AI scribe software for clinical note transcription with the patient, who gave verbal consent to proceed.  History of Present Illness   The patient, with a history of recurrent sinusitis, presents with a five-day history of upper respiratory symptoms. He reports a sore throat, which he attributes to post-nasal drainage, making swallowing difficult. The patient also notes some nasal drainage, which is clear in color. He experienced fever and chills a couple of days ago, but these symptoms have since resolved. The patient has been coughing but denies any productive sputum or shortness of breath.  Over-the-counter treatments, including [facility] cough and congestion medication, Tylenol, and Mucinex, have been ineffective in alleviating the symptoms. The patient has not taken a recent COVID-19 test. He has had cataract surgery twice in the past month, but does not believe this is related to his current symptoms.  The patient has previously been treated with doxycycline for similar symptoms and believes this antibiotic works best for him. He expresses a desire to start the antibiotic treatment immediately due to the discomfort caused by his sore throat and difficulty swallowing.       Medications: Outpatient Medications Prior to Visit  Medication Sig   acetaminophen (TYLENOL) 500 MG tablet Take 500 mg by mouth every 6 (six) hours as needed.   albuterol (VENTOLIN HFA) 108 (90 Base) MCG/ACT inhaler Inhale 2 puffs into the lungs every 6 (six) hours as needed for wheezing or shortness of breath.   famotidine (PEPCID) 20 MG tablet Take 20 mg by mouth daily.   Fluticasone Furoate (ARNUITY ELLIPTA) 100 MCG/ACT AEPB Inhale 1 puff into the  lungs daily.   latanoprost (XALATAN) 0.005 % ophthalmic solution Place 1 drop into both eyes daily.   levothyroxine (SYNTHROID) 50 MCG tablet Take 1 tablet (50 mcg total) by mouth daily.   montelukast (SINGULAIR) 10 MG tablet TAKE 1 TABLET BY MOUTH EVERY DAY   No facility-administered medications prior to visit.      Objective    BP (!) 146/54 (BP Location: Left Arm, Patient Position: Sitting, Cuff Size: Normal)   Pulse (!) 51   Temp 98 F (36.7 C) (Oral)   Ht 5\' 11"  (1.803 m)   Wt 150 lb 14.4 oz (68.4 kg)   SpO2 100%   BMI 21.05 kg/m   Physical Exam  Physical Exam   HEENT: Sinuses tender upon palpation, specifically above the eyes. CHEST: Lungs clear to auscultation.      No results found for any visits on 10/27/22.  Assessment & Plan     Assessment and Plan    Upper Respiratory Infection: Symptoms of sore throat, cough, and nasal drainage for 5 days. No shortness of breath or chest pain. Recent fever and chills have resolved. Over-the-counter treatments have not been effective. Patient has a history of chronic sinus problems. Counseled that current sx are due to viral URI and abx will not help, although he is susceptible to bacterial sinus infections -Prescribe Doxycycline, to [facility] [address] which he may start taking if sx worsen or do not resolve over the next several days.  -Advise patient to contact the office if symptoms  worsen or if there is difficulty breathing.           Mila Merry, MD  [facility] [phone] (phone) [phone] (fax)  [facility]

## 2022-11-01 ENCOUNTER — Other Ambulatory Visit: Payer: Self-pay | Admitting: Family Medicine

## 2022-11-01 DIAGNOSIS — E039 Hypothyroidism, unspecified: Secondary | ICD-10-CM

## 2022-11-01 NOTE — Telephone Encounter (Signed)
Requested Prescriptions  Pending Prescriptions Disp Refills   levothyroxine (SYNTHROID) 50 MCG tablet [Pharmacy Med Name: LEVOTHYROXINE 50 MCG TABLET] 90 tablet 4    Sig: TAKE 1 TABLET BY MOUTH EVERY DAY     Endocrinology:  Hypothyroid Agents Failed - 11/01/2022  2:33 AM      Failed - TSH in normal range and within 360 days    TSH  Date Value Ref Range Status  08/31/2021 4.150 0.450 - 4.500 uIU/mL Final         Passed - Valid encounter within last 12 months    Recent Outpatient Visits           5 days ago Bronchitis   [facility] Malva Limes, MD   4 months ago Restrictive lung disease   [place] [facility] Malva Limes, MD   1 year ago Generalized abdominal pain   [place] [facility] Malva Limes, MD   1 year ago Viral URI with cough    [facility] [place], Marzella Schlein, MD   2 years ago Bronchitis   [facility] Malva Limes, MD

## 2022-11-11 DIAGNOSIS — Z961 Presence of intraocular lens: Secondary | ICD-10-CM | POA: Diagnosis not present

## 2022-11-11 DIAGNOSIS — H35372 Puckering of macula, left eye: Secondary | ICD-10-CM | POA: Diagnosis not present

## 2022-12-09 DIAGNOSIS — D2261 Melanocytic nevi of right upper limb, including shoulder: Secondary | ICD-10-CM | POA: Diagnosis not present

## 2022-12-09 DIAGNOSIS — L570 Actinic keratosis: Secondary | ICD-10-CM | POA: Diagnosis not present

## 2022-12-09 DIAGNOSIS — D2272 Melanocytic nevi of left lower limb, including hip: Secondary | ICD-10-CM | POA: Diagnosis not present

## 2022-12-09 DIAGNOSIS — Z85828 Personal history of other malignant neoplasm of skin: Secondary | ICD-10-CM | POA: Diagnosis not present

## 2022-12-09 DIAGNOSIS — D2271 Melanocytic nevi of right lower limb, including hip: Secondary | ICD-10-CM | POA: Diagnosis not present

## 2022-12-09 DIAGNOSIS — D225 Melanocytic nevi of trunk: Secondary | ICD-10-CM | POA: Diagnosis not present

## 2022-12-09 DIAGNOSIS — D2262 Melanocytic nevi of left upper limb, including shoulder: Secondary | ICD-10-CM | POA: Diagnosis not present

## 2022-12-10 DIAGNOSIS — H4020X1 Unspecified primary angle-closure glaucoma, mild stage: Secondary | ICD-10-CM | POA: Diagnosis not present

## 2022-12-31 DIAGNOSIS — H4020X1 Unspecified primary angle-closure glaucoma, mild stage: Secondary | ICD-10-CM | POA: Diagnosis not present

## 2022-12-31 DIAGNOSIS — Z961 Presence of intraocular lens: Secondary | ICD-10-CM | POA: Diagnosis not present

## 2022-12-31 DIAGNOSIS — H209 Unspecified iridocyclitis: Secondary | ICD-10-CM | POA: Diagnosis not present

## 2022-12-31 DIAGNOSIS — H35372 Puckering of macula, left eye: Secondary | ICD-10-CM | POA: Diagnosis not present

## 2023-01-13 ENCOUNTER — Other Ambulatory Visit: Payer: Self-pay | Admitting: Family Medicine

## 2023-01-13 NOTE — Telephone Encounter (Signed)
Requested Prescriptions  Pending Prescriptions Disp Refills   montelukast (SINGULAIR) 10 MG tablet [Pharmacy Med Name: MONTELUKAST SOD 10 MG TABLET] 90 tablet 3    Sig: TAKE 1 TABLET BY MOUTH EVERY DAY     Pulmonology:  Leukotriene Inhibitors Passed - 01/13/2023  2:39 AM      Passed - Valid encounter within last 12 months    Recent Outpatient Visits           2 months ago Bronchitis   [facility] Malva Limes, MD   6 months ago Restrictive lung disease   [place] [facility] Malva Limes, MD   1 year ago Generalized abdominal pain   [facility] Malva Limes, MD   1 year ago Viral URI with cough   [facility] [place], Marzella Schlein, MD   2 years ago Bronchitis   [facility] Malva Limes, MD

## 2023-02-01 DIAGNOSIS — Z23 Encounter for immunization: Secondary | ICD-10-CM | POA: Diagnosis not present

## 2023-02-04 DIAGNOSIS — H209 Unspecified iridocyclitis: Secondary | ICD-10-CM | POA: Diagnosis not present

## 2023-02-04 DIAGNOSIS — H35372 Puckering of macula, left eye: Secondary | ICD-10-CM | POA: Diagnosis not present

## 2023-02-04 DIAGNOSIS — Z961 Presence of intraocular lens: Secondary | ICD-10-CM | POA: Diagnosis not present

## 2023-02-04 DIAGNOSIS — H4020X1 Unspecified primary angle-closure glaucoma, mild stage: Secondary | ICD-10-CM | POA: Diagnosis not present

## 2023-03-18 DIAGNOSIS — H4020X1 Unspecified primary angle-closure glaucoma, mild stage: Secondary | ICD-10-CM | POA: Diagnosis not present

## 2023-03-18 DIAGNOSIS — Z961 Presence of intraocular lens: Secondary | ICD-10-CM | POA: Diagnosis not present

## 2023-04-04 DIAGNOSIS — L309 Dermatitis, unspecified: Secondary | ICD-10-CM | POA: Diagnosis not present

## 2023-04-05 DIAGNOSIS — H4020X1 Unspecified primary angle-closure glaucoma, mild stage: Secondary | ICD-10-CM | POA: Diagnosis not present

## 2023-04-21 DIAGNOSIS — H209 Unspecified iridocyclitis: Secondary | ICD-10-CM | POA: Diagnosis not present

## 2023-04-21 DIAGNOSIS — H4020X1 Unspecified primary angle-closure glaucoma, mild stage: Secondary | ICD-10-CM | POA: Diagnosis not present

## 2023-04-21 DIAGNOSIS — S0501XA Injury of conjunctiva and corneal abrasion without foreign body, right eye, initial encounter: Secondary | ICD-10-CM | POA: Diagnosis not present

## 2023-05-02 DIAGNOSIS — H209 Unspecified iridocyclitis: Secondary | ICD-10-CM | POA: Diagnosis not present

## 2023-05-02 DIAGNOSIS — H4020X1 Unspecified primary angle-closure glaucoma, mild stage: Secondary | ICD-10-CM | POA: Diagnosis not present

## 2023-05-13 DIAGNOSIS — H4020X1 Unspecified primary angle-closure glaucoma, mild stage: Secondary | ICD-10-CM | POA: Diagnosis not present

## 2023-05-13 DIAGNOSIS — H209 Unspecified iridocyclitis: Secondary | ICD-10-CM | POA: Diagnosis not present

## 2023-05-17 ENCOUNTER — Other Ambulatory Visit: Payer: Self-pay | Admitting: Family Medicine

## 2023-05-17 DIAGNOSIS — E039 Hypothyroidism, unspecified: Secondary | ICD-10-CM

## 2023-06-06 DIAGNOSIS — H4020X1 Unspecified primary angle-closure glaucoma, mild stage: Secondary | ICD-10-CM | POA: Diagnosis not present

## 2023-06-06 DIAGNOSIS — H40043 Steroid responder, bilateral: Secondary | ICD-10-CM | POA: Diagnosis not present

## 2023-06-06 DIAGNOSIS — H209 Unspecified iridocyclitis: Secondary | ICD-10-CM | POA: Diagnosis not present

## 2023-06-06 DIAGNOSIS — Z961 Presence of intraocular lens: Secondary | ICD-10-CM | POA: Diagnosis not present

## 2023-06-21 ENCOUNTER — Other Ambulatory Visit: Payer: Self-pay | Admitting: Family Medicine

## 2023-06-22 ENCOUNTER — Telehealth: Payer: Self-pay | Admitting: Family Medicine

## 2023-06-22 NOTE — Telephone Encounter (Signed)
 Copied from CRM [id number]. Topic: Clinical - Medication Question >> Jun 22, 2023  9:17 AM Patsy Lager T wrote: Reason for CRM: patient called requesting a 90 day supply of Fluticasone Furoate (ARNUITY ELLIPTA) 100 MCG/ACT AEPB. He is aware the script has already been called in to the pharmacy.

## 2023-06-30 DIAGNOSIS — H209 Unspecified iridocyclitis: Secondary | ICD-10-CM | POA: Diagnosis not present

## 2023-06-30 DIAGNOSIS — Z961 Presence of intraocular lens: Secondary | ICD-10-CM | POA: Diagnosis not present

## 2023-06-30 DIAGNOSIS — H40043 Steroid responder, bilateral: Secondary | ICD-10-CM | POA: Diagnosis not present

## 2023-06-30 DIAGNOSIS — H4020X1 Unspecified primary angle-closure glaucoma, mild stage: Secondary | ICD-10-CM | POA: Diagnosis not present

## 2023-07-08 ENCOUNTER — Encounter: Payer: Self-pay | Admitting: Family Medicine

## 2023-07-08 ENCOUNTER — Ambulatory Visit (INDEPENDENT_AMBULATORY_CARE_PROVIDER_SITE_OTHER): Admitting: Family Medicine

## 2023-07-08 VITALS — BP 142/58 | HR 60 | Resp 16 | Wt 148.2 lb

## 2023-07-08 DIAGNOSIS — R1013 Epigastric pain: Secondary | ICD-10-CM

## 2023-07-08 DIAGNOSIS — J42 Unspecified chronic bronchitis: Secondary | ICD-10-CM | POA: Diagnosis not present

## 2023-07-08 DIAGNOSIS — G8929 Other chronic pain: Secondary | ICD-10-CM | POA: Diagnosis not present

## 2023-07-08 DIAGNOSIS — M549 Dorsalgia, unspecified: Secondary | ICD-10-CM

## 2023-07-08 DIAGNOSIS — E039 Hypothyroidism, unspecified: Secondary | ICD-10-CM | POA: Diagnosis not present

## 2023-07-08 DIAGNOSIS — J984 Other disorders of lung: Secondary | ICD-10-CM

## 2023-07-08 NOTE — Progress Notes (Signed)
 Established patient visit   Patient: JONH MCQUEARY   DOB: 12/25/1931   89 y.o. Male  MRN: [id number] Visit Date: 07/08/2023  Today's healthcare provider: Mila Merry, MD   No chief complaint on file.  Subjective    Discussed the use of AI scribe software for clinical note transcription with the patient, who gave verbal consent to proceed.  History of Present Illness   HO PARISI is an 89 year old male who presents for follow up hypothyroid and chronic bronchitis/restrictive lung disease, also reporting worsening back pain.  He has been experiencing worsening back pain since 2015, primarily located in the upper back and centered on the spine. The pain fluctuates in intensity, with severe episodes limiting his activities. He has not used prescription arthritis medications like Celebrex or meloxicam but takes Tylenol for relief.  He experiences intermittent pain in his right knee and occasionally in his hips, particularly when lying in bed. He has not used prescription medications for these symptoms either.  He has a long-standing issue with a burning sensation in his stomach every time he eats, for which he takes Pepcid. This has been a persistent problem for years, but he has not consulted a specialist due to concerns about potential surgery for a suspected hernia.  He uses an inhaler, Arnuity Ellipta, which he finds effective. He recently refilled his prescription and expressed a preference for a longer supply to avoid frequent pharmacy visits.     Lab Results  Component Value Date   TSH 4.150 08/31/2021   Lab Results  Component Value Date   NA 144 08/31/2021   K 4.3 08/31/2021   CREATININE 1.09 08/31/2021   EGFR 66 08/31/2021   GLUCOSE 108 (H) 08/31/2021     Medications: Outpatient Medications Prior to Visit  Medication Sig   acetaminophen (TYLENOL) 500 MG tablet Take 500 mg by mouth every 6 (six) hours as needed.   albuterol (VENTOLIN HFA) 108 (90 Base) MCG/ACT  inhaler Inhale 2 puffs into the lungs every 6 (six) hours as needed for wheezing or shortness of breath.   famotidine (PEPCID) 20 MG tablet Take 20 mg by mouth daily.   Fluticasone Furoate (ARNUITY ELLIPTA) 100 MCG/ACT AEPB TAKE 1 PUFF BY MOUTH EVERY DAY   latanoprost (XALATAN) 0.005 % ophthalmic solution Place 1 drop into both eyes daily.   levothyroxine (SYNTHROID) 50 MCG tablet TAKE 1 TABLET BY MOUTH EVERY DAY   montelukast (SINGULAIR) 10 MG tablet TAKE 1 TABLET BY MOUTH EVERY DAY   No facility-administered medications prior to visit.   Review of Systems  Constitutional:  Positive for fatigue. Negative for appetite change, chills and fever.  Respiratory:  Negative for chest tightness, shortness of breath and wheezing.   Cardiovascular:  Negative for chest pain and palpitations.  Gastrointestinal:  Negative for abdominal pain, nausea and vomiting.       Objective    BP (!) 142/58 (BP Location: Left Arm, Patient Position: Sitting, Cuff Size: Normal)   Pulse 60   Resp 16   Wt 148 lb 3.2 oz (67.2 kg)   SpO2 98%   BMI 20.67 kg/m   Physical Exam   General: Appearance:    Well developed, well nourished male in no acute distress  Eyes:    PERRL, conjunctiva/corneas clear, EOM's intact       Lungs:     Clear to auscultation bilaterally, respirations unlabored  Heart:    Normal heart rate. Normal rhythm. No murmurs, rubs,  or gallops.    MS:   All extremities are intact.    Neurologic:   Awake, alert, oriented x 3. No apparent focal neurological defect.         Assessment & Plan     1. Epigastric pain (Primary) Long standing. Need to rule out treatable causes. Consider starting PPI as we are also considering NSAID for arthralgias.   - CBC - Comprehensive metabolic panel with GFR - H. pylori breath test - Lipase  2. Adult hypothyroidism Doing well on current thyroid replacement.  - T4, free - TSH  3. Chronic midline back pain, unspecified back location Longstanding, but  getting progressively worse. Consider NSAID, but need to evaluate epigastric pain first, as above.   4. Restrictive lung disease   5. Chronic bronchitis, unspecified chronic bronchitis type (HCC) Doing very well on current maintenance inhaler.       Mila Merry, MD  [facility] [phone] (phone) [phone] (fax)  [facility]

## 2023-07-10 LAB — COMPREHENSIVE METABOLIC PANEL WITH GFR
ALT: 14 IU/L (ref 0–44)
AST: 24 IU/L (ref 0–40)
Albumin: 3.9 g/dL (ref 3.7–4.7)
Alkaline Phosphatase: 116 IU/L (ref 44–121)
BUN/Creatinine Ratio: 21 (ref 10–24)
BUN: 20 mg/dL (ref 8–27)
Bilirubin Total: 0.2 mg/dL (ref 0.0–1.2)
CO2: 22 mmol/L (ref 20–29)
Calcium: 9 mg/dL (ref 8.6–10.2)
Chloride: 105 mmol/L (ref 96–106)
Creatinine, Ser: 0.95 mg/dL (ref 0.76–1.27)
Globulin, Total: 2.4 g/dL (ref 1.5–4.5)
Glucose: 74 mg/dL (ref 70–99)
Potassium: 4.3 mmol/L (ref 3.5–5.2)
Sodium: 144 mmol/L (ref 134–144)
Total Protein: 6.3 g/dL (ref 6.0–8.5)
eGFR: 77 mL/min/{1.73_m2} (ref >59)

## 2023-07-10 LAB — CBC
Hematocrit: 36.4 % — ABNORMAL LOW (ref 37.5–51.0)
Hemoglobin: 11.6 g/dL — ABNORMAL LOW (ref 13.0–17.7)
MCH: 30.1 pg (ref 26.6–33.0)
MCHC: 31.9 g/dL (ref 31.5–35.7)
MCV: 95 fL (ref 79–97)
Platelets: 227 10*3/uL (ref 150–450)
RBC: 3.85 x10E6/uL — ABNORMAL LOW (ref 4.14–5.80)
RDW: 13.1 % (ref 11.6–15.4)
WBC: 6 10*3/uL (ref 3.4–10.8)

## 2023-07-10 LAB — TSH: TSH: 6.32 u[IU]/mL — ABNORMAL HIGH (ref 0.450–4.500)

## 2023-07-10 LAB — LIPASE: Lipase: 26 U/L (ref 13–78)

## 2023-07-10 LAB — T4, FREE: Free T4: 1.1 ng/dL (ref 0.82–1.77)

## 2023-07-10 LAB — H. PYLORI BREATH TEST

## 2023-07-11 ENCOUNTER — Other Ambulatory Visit: Payer: Self-pay | Admitting: Family Medicine

## 2023-07-11 DIAGNOSIS — G8929 Other chronic pain: Secondary | ICD-10-CM

## 2023-07-11 MED ORDER — NAPROXEN 500 MG PO TABS: 500.0000 mg | ORAL_TABLET | Freq: Two times a day (BID) | ORAL | 5 refills | Status: AC | PRN

## 2023-07-11 MED ORDER — PANTOPRAZOLE SODIUM 40 MG PO TBEC: 40.0000 mg | DELAYED_RELEASE_TABLET | Freq: Every day | ORAL | 5 refills | Status: DC

## 2023-07-15 ENCOUNTER — Other Ambulatory Visit: Payer: Self-pay | Admitting: Family Medicine

## 2023-07-15 DIAGNOSIS — J984 Other disorders of lung: Secondary | ICD-10-CM

## 2023-07-15 DIAGNOSIS — J42 Unspecified chronic bronchitis: Secondary | ICD-10-CM

## 2023-07-15 MED ORDER — ARNUITY ELLIPTA 100 MCG/ACT IN AEPB: 1.0000 | INHALATION_SPRAY | Freq: Every day | RESPIRATORY_TRACT | 6 refills | Status: AC

## 2023-08-01 DIAGNOSIS — H40043 Steroid responder, bilateral: Secondary | ICD-10-CM | POA: Diagnosis not present

## 2023-08-01 DIAGNOSIS — H4020X1 Unspecified primary angle-closure glaucoma, mild stage: Secondary | ICD-10-CM | POA: Diagnosis not present

## 2023-08-01 DIAGNOSIS — Z961 Presence of intraocular lens: Secondary | ICD-10-CM | POA: Diagnosis not present

## 2023-08-12 DIAGNOSIS — H4020X1 Unspecified primary angle-closure glaucoma, mild stage: Secondary | ICD-10-CM | POA: Diagnosis not present

## 2023-08-16 ENCOUNTER — Other Ambulatory Visit: Payer: Self-pay | Admitting: Family Medicine

## 2023-08-16 DIAGNOSIS — E039 Hypothyroidism, unspecified: Secondary | ICD-10-CM

## 2023-08-18 DIAGNOSIS — H4020X1 Unspecified primary angle-closure glaucoma, mild stage: Secondary | ICD-10-CM | POA: Diagnosis not present

## 2023-11-02 ENCOUNTER — Ambulatory Visit: Payer: Self-pay

## 2023-11-02 NOTE — Telephone Encounter (Signed)
 FYI Only or Action Required?: FYI only for provider.  Patient was last seen in primary care on 07/08/2023 by Jesse Nancyann BRAVO, MD.  Called Nurse Triage reporting Ankle Injury.  Symptoms began today.  Interventions attempted: Other: cleaned area with alcohol , applied bandage.  Symptoms are: stable.  Triage Disposition: See Physician Within 24 Hours  Patient/caregiver understands and will follow disposition?: Yes   Copied from CRM #[id number]. Topic: Clinical - Red Word Triage >> Nov 02, 2023  3:16 PM Tiffini S wrote: [facility] that prompted transfer to Nurse Triage: Patient called stating that a watery fluid is coming out of his right ankle- hit the ankle on the door two hours ago- dried the area off and still is watery/ reappears. Reason for Disposition  Large swelling or bruise (> 2 inches or 5 cm)    Swelling and drainage-clear  Answer Assessment - Initial Assessment Questions 1. MECHANISM: How did the injury happen? (e.g., twisting injury, direct blow)      Car door hit anke 2. ONSET: When did the injury happen? (e.g., minutes or hours ago)      2 hours ago 3. LOCATION: Where is the injury located?      Right ankle  4. APPEARANCE of INJURY: What does the injury look like?      na 5. WEIGHT-BEARING: Can you put weight on that foot? Can you walk (four steps or more)?       yes 6. SIZE: For cuts, bruises, or swelling, ask: How large is it? (e.g., inches or centimeters; entire joint)      N/a 7. PAIN: Is there pain? If Yes, ask: How bad is the pain?  What does it keep you from doing? (Scale 0-10; or none, mild, moderate, severe)     no 8. TETANUS: For any breaks in the skin, ask: When was your last tetanus booster?     na 9. OTHER SYMPTOMS: Do you have any other symptoms?      Clear drainage 10. PREGNANCY: Is there any chance you are pregnant? When was your last menstrual period?       Na  Previously had some swelling to area for years but no  drainage ever. Hit ankle with car door it bleed for a little while.  Then clear drainage has been leaking from area ever since.  Protocols used: Ankle Injury-A-AH

## 2023-11-03 ENCOUNTER — Encounter: Payer: Self-pay | Admitting: Physician Assistant

## 2023-11-03 ENCOUNTER — Ambulatory Visit (INDEPENDENT_AMBULATORY_CARE_PROVIDER_SITE_OTHER): Admitting: Physician Assistant

## 2023-11-03 VITALS — BP 139/60 | HR 63 | Resp 16 | Ht 71.0 in | Wt 152.4 lb

## 2023-11-03 DIAGNOSIS — R030 Elevated blood-pressure reading, without diagnosis of hypertension: Secondary | ICD-10-CM | POA: Diagnosis not present

## 2023-11-03 DIAGNOSIS — Z23 Encounter for immunization: Secondary | ICD-10-CM

## 2023-11-03 DIAGNOSIS — S99919A Unspecified injury of unspecified ankle, initial encounter: Secondary | ICD-10-CM

## 2023-11-03 DIAGNOSIS — S81811A Laceration without foreign body, right lower leg, initial encounter: Secondary | ICD-10-CM | POA: Diagnosis not present

## 2023-11-03 DIAGNOSIS — I872 Venous insufficiency (chronic) (peripheral): Secondary | ICD-10-CM

## 2023-11-03 DIAGNOSIS — S81819A Laceration without foreign body, unspecified lower leg, initial encounter: Secondary | ICD-10-CM

## 2023-11-03 MED ORDER — TETANUS-DIPHTH-ACELL PERTUSSIS 5-2.5-18.5 LF-MCG/0.5 IM SUSY: 0.5000 mL | PREFILLED_SYRINGE | Freq: Once | INTRAMUSCULAR | 0 refills | Status: AC

## 2023-11-03 MED ORDER — TETANUS-DIPHTH-ACELL PERTUSSIS 5-2.5-18.5 LF-MCG/0.5 IM SUSP: 0.5000 mL | Freq: Once | INTRAMUSCULAR | 0 refills | Status: DC

## 2023-11-03 NOTE — Progress Notes (Signed)
 Established patient visit  Patient: Jesse Sandoval   DOB: [birth year]   89 y.o. Male  MRN: [id number] Visit Date: 11/03/2023  Today's healthcare provider: Jolynn Spencer, PA-C   Chief Complaint  Patient presents with   Ankle Injury    Hit ankle on the car door, dried the area off and still is watery reappears. Swelling(ongoing for years) and drainage-clear onset 1 day mild cut   Subjective     HPI     Ankle Injury    Additional comments: Hit ankle on the car door, dried the area off and still is watery reappears. Swelling(ongoing for years) and drainage-clear onset 1 day mild cut      Last edited by Wilfred Hargis RAMAN, CMA on 11/03/2023  9:06 AM.       Discussed the use of AI scribe software for clinical note transcription with the patient, who gave verbal consent to proceed.  History of Present Illness Jesse Sandoval is an 89 year old male who presents with a leg injury and wound drainage.  He injured his leg yesterday at around 2 PM when the car door hit his leg while exiting his vehicle in the rain. The wound has been leaking fluid down to his foot since the incident. He experiences no pain but is concerned about wound healing and fluid leakage.  There is pre-existing redness on the leg, for which he saw a dermatologist in January. Treatment with an oil and cream alleviated itching but not the redness. The dermatologist suggested possible iron overload. The redness is unrelated to the recent injury.  He has a history of leg swelling due to a venous issue diagnosed in 2008, attributed to a valve leak in his knee. Swelling is a long-standing issue.  He manages asthma with an inhaler and montelukast . He experiences no chest pain, shortness of breath, or rapid heartbeat. He denies diabetes or blood sugar issues.       07/08/2023   10:57 AM 06/18/2022    1:13 PM 08/31/2021    2:10 PM  Depression screen PHQ 2/9  Decreased Interest 1 0 0  Down, Depressed, Hopeless 0 0 0  PHQ - 2 Score 1 0 0   Altered sleeping 0 0 0  Tired, decreased energy 3 3 2   Change in appetite 0 1 0  Feeling bad or failure about yourself  0 0 0  Trouble concentrating 0 0 0  Moving slowly or fidgety/restless 0 0 0  Suicidal thoughts 0 0 0  PHQ-9 Score 4 4 2   Difficult doing work/chores Somewhat difficult Not difficult at all Not difficult at all      07/08/2023   10:58 AM  GAD 7 : Generalized Anxiety Score  Nervous, Anxious, on Edge 1  Control/stop worrying 1  Worry too much - different things 1  Trouble relaxing 0  Restless 0  Easily annoyed or irritable 1  Afraid - awful might happen 0  Total GAD 7 Score 4  Anxiety Difficulty Somewhat difficult    Medications: Outpatient Medications Prior to Visit  Medication Sig   acetaminophen (TYLENOL) 500 MG tablet Take 500 mg by mouth every 6 (six) hours as needed.   albuterol  (VENTOLIN  HFA) 108 (90 Base) MCG/ACT inhaler Inhale 2 puffs into the lungs every 6 (six) hours as needed for wheezing or shortness of breath.   famotidine (PEPCID) 20 MG tablet Take 20 mg by mouth daily.   Fluticasone  Furoate (ARNUITY ELLIPTA ) 100 MCG/ACT AEPB Inhale 1 puff into the  lungs daily.   latanoprost (XALATAN) 0.005 % ophthalmic solution Place 1 drop into both eyes daily.   levothyroxine  (SYNTHROID ) 50 MCG tablet TAKE 1 TABLET BY MOUTH EVERY DAY   montelukast  (SINGULAIR ) 10 MG tablet TAKE 1 TABLET BY MOUTH EVERY DAY   naproxen  (NAPROSYN ) 500 MG tablet Take 1 tablet (500 mg total) by mouth 2 (two) times daily as needed (back pain).   pantoprazole  (PROTONIX ) 40 MG tablet Take 1 tablet (40 mg total) by mouth daily.   No facility-administered medications prior to visit.    Review of Systems All negative Except see HPI       Objective    BP (!) 142/56 (BP Location: Left Arm, Patient Position: Sitting, Cuff Size: Normal)   Pulse 63   Resp 16   Ht 5' 11 (1.803 m)   Wt 152 lb 6.4 oz (69.1 kg)   SpO2 100%   BMI 21.26 kg/m     Physical Exam Vitals reviewed.   Constitutional:      General: He is not in acute distress.    Appearance: Normal appearance. He is not diaphoretic.  HENT:     Head: Normocephalic and atraumatic.  Eyes:     General: No scleral icterus.    Conjunctiva/sclera: Conjunctivae normal.  Cardiovascular:     Rate and Rhythm: Normal rate and regular rhythm.     Pulses: Normal pulses.     Heart sounds: Normal heart sounds. No murmur heard. Pulmonary:     Effort: Pulmonary effort is normal. No respiratory distress.     Breath sounds: Normal breath sounds. No wheezing or rhonchi.  Musculoskeletal:     Cervical back: Neck supple.     Right lower leg: No edema.     Left lower leg: No edema.  Lymphadenopathy:     Cervical: No cervical adenopathy.  Skin:    General: Skin is warm and dry.     Findings: Lesion (see photo in the media) present. No rash.  Neurological:     Mental Status: He is alert and oriented to person, place, and time. Mental status is at baseline.  Psychiatric:        Mood and Affect: Mood normal.        Behavior: Behavior normal.      No results found for any visits on 11/03/23.      Assessment & Plan Traumatic wound of lower leg with oozing and abrasion Sustained a traumatic wound with oozing and abrasions. Open wound cannot be sutured immediately due to oozing and infection risk. - [facility] - Provide saline, antibiotics, and Vaseline for wound care. - Instruct to keep wound clean and change dressings daily. - Schedule follow-up in 7 days to assess healing and determine need for suturing. - Advise to apply pressure with gauze to manage oozing. - Instruct to avoid tight bandages to prevent infection. - Allow showering with caution, ensuring wound cleanliness. Wound care was provided by CMA, Hargis Jubilee. *Consider a referral to wound clinic, additional prescription topical and HH for wound care. Pt declined. Pt advised to proceed to ED if symptoms  worsen Follow-up with pcp  Chronic venous insufficiency of lower extremity Stasis dermatitis Chronic venous insufficiency with valve leakage in knee area complicates wound healing.  1. Elevated blood pressure reading Could be due to pain Advised low salt diet and staying active Will follow-up  2. Ankle injury, unspecified laterality, initial encounter  3. Prescription for Tdap. Vaccine not administered in  office.  He is overdue for tdap - Tdap (BOOSTRIX) 5-2.5-18.5 LF-MCG/0.5 injection; Inject 0.5 mLs into the muscle once for 1 dose.  Dispense: 0.5 mL; Refill: 0  4. Chronic venous insufficiency  5. Stasis dermatitis  6. Laceration of the leg  No orders of the defined types were placed in this encounter.   No follow-ups on file.   The patient was advised to call back or seek an in-person evaluation if the symptoms worsen or if the condition fails to improve as anticipated.  I discussed the assessment and treatment plan with the patient. The patient was provided an opportunity to ask questions and all were answered. The patient agreed with the plan and demonstrated an understanding of the instructions.  I, Yazmine Sorey, PA-C have reviewed all documentation for this visit. The documentation on 11/03/2023  for the exam, diagnosis, procedures, and orders are all accurate and complete.  Jolynn Spencer, [facility], MMS [facility] [phone] (phone) [phone] (fax)  [facility]

## 2023-11-11 ENCOUNTER — Ambulatory Visit (INDEPENDENT_AMBULATORY_CARE_PROVIDER_SITE_OTHER): Admitting: Family Medicine

## 2023-11-11 ENCOUNTER — Encounter: Payer: Self-pay | Admitting: Family Medicine

## 2023-11-11 VITALS — BP 135/63 | HR 62 | Ht 71.0 in | Wt 151.0 lb

## 2023-11-11 DIAGNOSIS — K219 Gastro-esophageal reflux disease without esophagitis: Secondary | ICD-10-CM | POA: Diagnosis not present

## 2023-11-11 DIAGNOSIS — M170 Bilateral primary osteoarthritis of knee: Secondary | ICD-10-CM | POA: Diagnosis not present

## 2023-11-11 DIAGNOSIS — S81819A Laceration without foreign body, unspecified lower leg, initial encounter: Secondary | ICD-10-CM

## 2023-11-11 DIAGNOSIS — S81819D Laceration without foreign body, unspecified lower leg, subsequent encounter: Secondary | ICD-10-CM

## 2023-11-11 DIAGNOSIS — R600 Localized edema: Secondary | ICD-10-CM

## 2023-11-11 NOTE — Patient Instructions (Signed)
 SABRA  Please review the attached list of medications and notify my office if there are any errors.   . Please bring all of your medications to every appointment so we can make sure that our medication list is the same as yours.

## 2023-11-26 NOTE — Progress Notes (Signed)
 Established patient visit   Patient: Jesse Sandoval   DOB: 04/28/1931   89 y.o. Male  MRN: [id number] Visit Date: 11/11/2023  Today's healthcare provider: Nancyann Perry, MD   Chief Complaint  Patient presents with   Laceration    Laceration on his lower R leg has been leaking  since his last visit, today has been a little better    Subjective    Discussed the use of AI scribe software for clinical note transcription with the patient, who gave verbal consent to proceed.  History of Present Illness   Jesse Sandoval is an 89 year old male who presents with a non-healing leg wound.  He has a non-healing wound on his leg that occurred approximately a week and a half ago after cutting his leg on the edge of a corridor. The wound has been leaking clear fluid since the injury. He notes slight improvement but it is not completely healed. He has been using an antibiotic cream, which he has now run out of, and received a tetanus shot after the injury. The wound is not painful but is described as 'tired'.  He has a history of leg swelling, and reports being told by a doctor that it may be related to a valve issue. The swelling is more pronounced in the right leg, and fluid is seeping out through the wound. The fluid is clear, similar to water, and not bloody.  He has been taking pantoprazole  for stomach issues, which initially helped but symptoms returned after discontinuing the medication. He has been on pantoprazole  for about three months, exceeding the recommended eight weeks. He also stopped taking naproxen  due to concerns about its effects on his stomach and potential interactions with his asthma. His stomach issues have returned to his previous state, characterized by acid indigestion after eating.       Medications: Outpatient Medications Prior to Visit  Medication Sig   acetaminophen (TYLENOL) 500 MG tablet Take 500 mg by mouth every 6 (six) hours as needed.   albuterol  (VENTOLIN  HFA) 108  (90 Base) MCG/ACT inhaler Inhale 2 puffs into the lungs every 6 (six) hours as needed for wheezing or shortness of breath.   famotidine (PEPCID) 20 MG tablet Take 20 mg by mouth daily.   Fluticasone  Furoate (ARNUITY ELLIPTA ) 100 MCG/ACT AEPB Inhale 1 puff into the lungs daily.   latanoprost (XALATAN) 0.005 % ophthalmic solution Place 1 drop into both eyes daily.   levothyroxine  (SYNTHROID ) 50 MCG tablet TAKE 1 TABLET BY MOUTH EVERY DAY   montelukast  (SINGULAIR ) 10 MG tablet TAKE 1 TABLET BY MOUTH EVERY DAY   naproxen  (NAPROSYN ) 500 MG tablet Take 1 tablet (500 mg total) by mouth 2 (two) times daily as needed (back pain). (Patient not taking: Reported on 11/11/2023)   pantoprazole  (PROTONIX ) 40 MG tablet Take 1 tablet (40 mg total) by mouth daily. (Patient not taking: Reported on 11/11/2023)   No facility-administered medications prior to visit.   Review of Systems  Constitutional:  Negative for appetite change, chills and fever.  Respiratory:  Negative for chest tightness, shortness of breath and wheezing.   Cardiovascular:  Negative for chest pain and palpitations.  Gastrointestinal:  Negative for abdominal pain, nausea and vomiting.       Objective    BP 135/63   Pulse 62   Ht 5' 11 (1.803 m)   Wt 151 lb (68.5 kg)   SpO2 100%   BMI 21.06 kg/m   Physical  Exam      Assessment & Plan       Right lower leg laceration with delayed healing and serous drainage Laceration healing with serous drainage due to edema. No infection. Tetanus shot received. Expected drainage for 5-6 more days. - Continue antibiotic cream until healed. - Apply Band-Aid with extra gauze. - Change bandage every 2-3 days.  Chronic bilateral lower extremity edema and venous insufficiency Chronic edema with venous insufficiency causing fluid accumulation and serous drainage. Exacerbated by prolonged sitting. - Advise walking every couple of hours during travel.  Gastroesophageal reflux disease (GERD) GERD  symptoms returned after stopping pantoprazole . Recommended to continue due to persistent symptoms. - Restart pantoprazole . - Take pantoprazole  if naproxen  is resumed.  Osteoarthritis Osteoarthritis management complicated by naproxen  use concerns due to asthma and stomach irritation. Tylenol suggested as safer. - Use Tylenol for pain management.    Return in about 2 months (around 01/11/2024).     Nancyann Perry, MD  [facility] [phone] (phone) [phone] (fax)  [facility]

## 2023-12-12 DIAGNOSIS — L570 Actinic keratosis: Secondary | ICD-10-CM | POA: Diagnosis not present

## 2023-12-12 DIAGNOSIS — R600 Localized edema: Secondary | ICD-10-CM | POA: Diagnosis not present

## 2023-12-12 DIAGNOSIS — L821 Other seborrheic keratosis: Secondary | ICD-10-CM | POA: Diagnosis not present

## 2023-12-12 DIAGNOSIS — Z85828 Personal history of other malignant neoplasm of skin: Secondary | ICD-10-CM | POA: Diagnosis not present

## 2023-12-12 DIAGNOSIS — D2261 Melanocytic nevi of right upper limb, including shoulder: Secondary | ICD-10-CM | POA: Diagnosis not present

## 2023-12-12 DIAGNOSIS — D225 Melanocytic nevi of trunk: Secondary | ICD-10-CM | POA: Diagnosis not present

## 2023-12-12 DIAGNOSIS — D2262 Melanocytic nevi of left upper limb, including shoulder: Secondary | ICD-10-CM | POA: Diagnosis not present

## 2023-12-30 DIAGNOSIS — H401132 Primary open-angle glaucoma, bilateral, moderate stage: Secondary | ICD-10-CM | POA: Diagnosis not present

## 2024-01-05 DIAGNOSIS — H401132 Primary open-angle glaucoma, bilateral, moderate stage: Secondary | ICD-10-CM | POA: Diagnosis not present

## 2024-01-13 ENCOUNTER — Encounter: Payer: Self-pay | Admitting: Family Medicine

## 2024-01-13 ENCOUNTER — Ambulatory Visit (INDEPENDENT_AMBULATORY_CARE_PROVIDER_SITE_OTHER): Admitting: Family Medicine

## 2024-01-13 ENCOUNTER — Ambulatory Visit
Admission: RE | Admit: 2024-01-13 | Discharge: 2024-01-13 | Disposition: A | Discharge status: Home / Self Care | Source: Ambulatory Visit | Attending: Family Medicine | Admitting: Family Medicine

## 2024-01-13 ENCOUNTER — Ambulatory Visit
Admission: RE | Admit: 2024-01-13 | Discharge: 2024-01-13 | Disposition: A | Discharge status: Home / Self Care | Attending: Family Medicine | Admitting: Family Medicine

## 2024-01-13 VITALS — BP 139/63 | HR 59 | Wt 153.4 lb

## 2024-01-13 DIAGNOSIS — M170 Bilateral primary osteoarthritis of knee: Secondary | ICD-10-CM | POA: Diagnosis not present

## 2024-01-13 DIAGNOSIS — M25561 Pain in right knee: Secondary | ICD-10-CM | POA: Diagnosis not present

## 2024-01-13 DIAGNOSIS — E039 Hypothyroidism, unspecified: Secondary | ICD-10-CM | POA: Diagnosis not present

## 2024-01-13 DIAGNOSIS — R600 Localized edema: Secondary | ICD-10-CM | POA: Diagnosis not present

## 2024-01-13 DIAGNOSIS — M85861 Other specified disorders of bone density and structure, right lower leg: Secondary | ICD-10-CM | POA: Diagnosis not present

## 2024-01-13 DIAGNOSIS — M25461 Effusion, right knee: Secondary | ICD-10-CM | POA: Diagnosis not present

## 2024-01-13 NOTE — Progress Notes (Signed)
 Established patient visit   Patient: Jesse Sandoval   DOB: 1931/12/24   89 y.o. Male  MRN: [id number] Visit Date: 01/13/2024  Today's healthcare provider: Nancyann Perry, MD   Chief Complaint  Patient presents with   Follow-up    2 month follow-up Osteoarthritis   Knee Pain   Subjective    Discussed the use of AI scribe software for clinical note transcription with the patient, who gave verbal consent to proceed.  History of Present Illness   Jesse Sandoval is a 89 year old male with venous insufficiency who presents with right knee pain and right lower leg swelling.  He has been experiencing ongoing issues with his right knee, which have worsened recently. A previous injury in August, caused by slamming a car door, resulted in swelling in his right lower leg. Although the swelling is improving, discomfort persists. He has not been taking any regular medication for the knee pain, occasionally using Tylenol, but avoids NSAIDs due to stomach issues.  He reports a history of venous insufficiency, with a 'bad valve' causing fluid retention and swelling in his right leg. The swelling is primarily in the right leg, with minimal swelling in the left. He attributes some of the swelling to the previous knee injury.  He is also due for a thyroid  function check and continues to take levothyroxine  at a dose of 50 micrograms daily.     Last thyroid  functions Lab Results  Component Value Date   TSH 6.320 (H) 07/08/2023   T4TOTAL 5.4 09/06/2016   FREET4 1.10 07/08/2023      Medications: Outpatient Medications Prior to Visit  Medication Sig   acetaminophen (TYLENOL) 500 MG tablet Take 500 mg by mouth every 6 (six) hours as needed.   albuterol  (VENTOLIN  HFA) 108 (90 Base) MCG/ACT inhaler Inhale 2 puffs into the lungs every 6 (six) hours as needed for wheezing or shortness of breath.   famotidine (PEPCID) 20 MG tablet Take 20 mg by mouth daily.   Fluticasone  Furoate (ARNUITY ELLIPTA ) 100  MCG/ACT AEPB Inhale 1 puff into the lungs daily.   latanoprost (XALATAN) 0.005 % ophthalmic solution Place 1 drop into both eyes daily.   levothyroxine  (SYNTHROID ) 50 MCG tablet TAKE 1 TABLET BY MOUTH EVERY DAY   montelukast  (SINGULAIR ) 10 MG tablet TAKE 1 TABLET BY MOUTH EVERY DAY   pantoprazole  (PROTONIX ) 40 MG tablet Take 1 tablet (40 mg total) by mouth daily.   naproxen  (NAPROSYN ) 500 MG tablet Take 1 tablet (500 mg total) by mouth 2 (two) times daily as needed (back pain). (Patient not taking: Reported on 11/11/2023)   No facility-administered medications prior to visit.   Review of Systems  Constitutional:  Negative for appetite change, chills and fever.  Respiratory:  Negative for chest tightness, shortness of breath and wheezing.   Cardiovascular:  Negative for chest pain and palpitations.  Gastrointestinal:  Negative for abdominal pain, nausea and vomiting.       Objective    BP 139/63 (BP Location: Left Arm, Patient Position: Sitting, Cuff Size: Normal)   Pulse (!) 59   Wt 153 lb 6.4 oz (69.6 kg)   SpO2 100%   BMI 21.39 kg/m   Physical Exam   General appearance: Well developed, well nourished male, cooperative and in no acute distress Head: Normocephalic, without obvious abnormality, atraumatic Respiratory: Respirations even and unlabored, normal respiratory rate Extremities: Tender right knee joint line. 2+ lower leg edema right. No cords.  Skin: Skin  color, texture, turgor normal. No rashes seen  Psych: Appropriate mood and affect. Neurologic: Mental status: Alert, oriented to person, place, and time, thought content appropriate.    Assessment & Plan    1. Adult hypothyroidism (Primary) Tolerating current dose of levothyroxine .  - TSH + free T4  2. Primary osteoarthritis of both knees Worsening since last visit. - DG Knee Complete 4 Views Right; Future Consider orthopedic referral.   3. Bilateral leg edema Likely secondary to chronic venous insufficiency.  Stable.  No follow-ups on file.     Nancyann Perry, MD  [facility] [phone] (phone) [phone] (fax)  [facility]

## 2024-01-14 ENCOUNTER — Ambulatory Visit: Payer: Self-pay | Admitting: Family Medicine

## 2024-01-14 LAB — TSH+FREE T4
Free T4: 1.04 ng/dL (ref 0.82–1.77)
TSH: 5.99 u[IU]/mL — ABNORMAL HIGH (ref 0.450–4.500)

## 2024-01-16 ENCOUNTER — Other Ambulatory Visit: Payer: Self-pay

## 2024-01-16 DIAGNOSIS — M170 Bilateral primary osteoarthritis of knee: Secondary | ICD-10-CM

## 2024-01-23 ENCOUNTER — Other Ambulatory Visit: Payer: Self-pay | Admitting: Family Medicine

## 2024-01-23 ENCOUNTER — Telehealth: Payer: Self-pay

## 2024-01-23 DIAGNOSIS — I872 Venous insufficiency (chronic) (peripheral): Secondary | ICD-10-CM

## 2024-01-23 NOTE — Telephone Encounter (Signed)
Spoke with patient and made aware of results.

## 2024-01-23 NOTE — Telephone Encounter (Signed)
 Copied from CRM [id number]. Topic: Clinical - Lab/Test Results >> Jan 23, 2024  9:11 AM Darshell M wrote: Reason for CRM: Patient seeking results for x-ray from 01/13/24. CB# [phone]

## 2024-01-23 NOTE — Progress Notes (Signed)
 Patient called requesting referral to vein clinic for varicose veins, edema, and vascular insufficiency.

## 2024-01-24 DIAGNOSIS — M1711 Unilateral primary osteoarthritis, right knee: Secondary | ICD-10-CM | POA: Diagnosis not present

## 2024-01-25 ENCOUNTER — Ambulatory Visit: Payer: Self-pay

## 2024-01-25 NOTE — Telephone Encounter (Signed)
 FYI Only or Action Required?: FYI only for provider.  Patient was last seen in primary care on 01/13/2024 by Gasper Nancyann BRAVO, MD.  Called Nurse Triage reporting Leg Swelling.  Symptoms began x 2 months ago .  Interventions attempted: Rest, hydration, or home remedies.  Symptoms are: unchanged.  Triage Disposition: See Physician Within 24 Hours  Patient/caregiver understands and will follow disposition?: Yes  **Appt. Scheduled 10/30           Copied from CRM #[id number]. Topic: Clinical - Red Word Triage >> Jan 25, 2024  8:42 AM Victoria B wrote: Patient right leg is swollen Reason for Disposition  [1] MODERATE leg swelling (e.g., swelling extends up to knees) AND [2] new-onset or getting worse  Answer Assessment - Initial Assessment Questions 1. ONSET: When did the swelling start? (e.g., minutes, hours, days)     X 2 months, patient stated PCP is aware of the swelling during 10/17 visit   2. LOCATION: What part of the leg is swollen?  Are both legs swollen or just one leg?     Right leg, below the knee   3. SEVERITY: How bad is the swelling? (e.g., localized; mild, moderate, severe)     Moderate   4. REDNESS: Is there redness or signs of infection?      Redness at ankle, patient was seen by dermatologist in January in which cream was prescribed, not sure if it is related   5. PAIN: Is the swelling painful to touch? If Yes, ask: How painful is it?   (Scale 1-10; mild, moderate or severe)     Mild   6. FEVER: Do you have a fever? If Yes, ask: What is it, how was it measured, and when did it start?      No   7. CAUSE: What do you think is causing the leg swelling?      He states he has a bad valve in his right knee several years ago   8. MEDICAL HISTORY: Do you have a history of blood clots (e.g., DVT), cancer, heart failure, kidney disease, or liver failure?     No   9. RECURRENT SYMPTOM: Have you had leg swelling before? If Yes, ask:  When was the last time? What happened that time?      Ongoing for a couple of months per patient   10. OTHER SYMPTOMS: Do you have any other symptoms? (e.g., chest pain, difficulty breathing) No   Appt. Scheduled for 10/30 for further evaluation as patient stated something is not right.  Protocols used: Leg Swelling and Edema-A-AH

## 2024-01-26 ENCOUNTER — Ambulatory Visit (INDEPENDENT_AMBULATORY_CARE_PROVIDER_SITE_OTHER)

## 2024-01-26 ENCOUNTER — Other Ambulatory Visit: Payer: Self-pay

## 2024-01-26 ENCOUNTER — Ambulatory Visit
Admission: RE | Admit: 2024-01-26 | Discharge: 2024-01-26 | Disposition: A | Discharge status: Home / Self Care | Source: Ambulatory Visit

## 2024-01-26 VITALS — BP 145/47 | HR 56 | Resp 16 | Ht 71.0 in | Wt 153.0 lb

## 2024-01-26 DIAGNOSIS — M7989 Other specified soft tissue disorders: Secondary | ICD-10-CM

## 2024-01-26 DIAGNOSIS — I872 Venous insufficiency (chronic) (peripheral): Secondary | ICD-10-CM | POA: Diagnosis not present

## 2024-01-26 DIAGNOSIS — R601 Generalized edema: Secondary | ICD-10-CM | POA: Diagnosis not present

## 2024-01-26 DIAGNOSIS — I440 Atrioventricular block, first degree: Secondary | ICD-10-CM | POA: Diagnosis not present

## 2024-01-26 NOTE — Progress Notes (Signed)
 Acute visit   Patient: Jesse Sandoval   DOB: [birth year]   89 y.o. Male  MRN: [id number] PCP: Gasper Nancyann BRAVO, MD   Chief Complaint  Patient presents with   Acute Visit    Leg Swelling ( for years)   Subjective    Discussed the use of AI scribe software for clinical note transcription with the patient, who gave verbal consent to proceed.  History of Present Illness Jesse Sandoval is a 89 year old male who presents with right leg swelling.  He has experienced swelling predominantly in his right leg, which has been larger than the left for several years. However, swelling on his R leg has recently worsened and reports having pain in R calf. Recently, he received a cortisone injection in the right knee due to arthritis. In 2008, he had his veins checked by Dr. Seward, who told him he had a bad valve in his knee, which he was told could be causing swelling. His wife is concerned about the possibility of a blood clot. He has not had an ultrasound of the leg to check for clots since the vein check in 2008.  He experienced a cut on his right leg from a car door, which leaked for a week and a half but is now healing.  He recently had blood work done for his thyroid , which was normal, but no other tests were conducted at that time.  Denies shortness of breath or chest pain. Denies hx of heart failure.   Review of systems as noted in HPI.   Objective    BP (!) 145/47 (BP Location: Right Arm, Patient Position: Sitting)   Pulse (!) 56   Resp 16   Ht 5' 11 (1.803 m)   Wt 153 lb (69.4 kg)   SpO2 100%   BMI 21.34 kg/m  Physical Exam Constitutional:      Appearance: Normal appearance.  HENT:     Head: Normocephalic and atraumatic.     Mouth/Throat:     Mouth: Mucous membranes are moist.  Eyes:     Pupils: Pupils are equal, round, and reactive to light.  Pulmonary:     Effort: Pulmonary effort is normal.  Musculoskeletal:     Right lower leg: 3+ Edema present.     Left lower leg: 2+  Edema present.     Comments: R lower leg: Erythema noted around R ankle  Skin:    General: Skin is warm.  Neurological:     General: No focal deficit present.     Mental Status: He is alert.       No results found for any visits on 01/26/24.  Assessment & Plan     Problem List Items Addressed This Visit       Cardiovascular and Mediastinum   Chronic venous insufficiency   Other Visit Diagnoses       Right leg swelling    -  Primary   Relevant Orders   Comprehensive metabolic panel with GFR   Magnesium    Brain natriuretic peptide   US  Venous Img Lower Bilateral (DVT)     Generalized edema       Relevant Orders   Brain natriuretic peptide       Assessment & Plan Right leg swelling Patient with R>L acute on chronic lower leg swelling that has recently worsened. Previously diagnosed with chronic venous insufficiency. Given worsening swelling in R leg, will obtain US  to evaluate for DVT.  No acute respiratory symptoms noted. - Order stat ultrasound of the right lower extremity to rule out deep vein thrombosis. - Perform blood work to assess electrolytes, kidney function, and heart function to evaluate for other causes of swelling.  Chronic venous insufficiency Chronic edema and skin changes likely due to venous insufficiency. - Recommend use of compression socks and leg elevation to manage edema if US  negative - Referred to vein specialist for further evaluation and management of venous insufficiency.  No orders of the defined types were placed in this encounter.    No follow-ups on file.      Jesse DELENA Pepper, MD  [facility] [phone] (phone) [phone] (fax)

## 2024-01-27 ENCOUNTER — Ambulatory Visit: Payer: Self-pay

## 2024-01-27 ENCOUNTER — Other Ambulatory Visit: Payer: Self-pay

## 2024-01-27 ENCOUNTER — Other Ambulatory Visit: Payer: Self-pay | Admitting: Family Medicine

## 2024-01-27 DIAGNOSIS — R601 Generalized edema: Secondary | ICD-10-CM

## 2024-01-27 LAB — COMPREHENSIVE METABOLIC PANEL WITH GFR
ALT: 15 IU/L (ref 0–44)
AST: 19 IU/L (ref 0–40)
Albumin: 3.7 g/dL (ref 3.6–4.6)
Alkaline Phosphatase: 113 IU/L (ref 48–129)
BUN/Creatinine Ratio: 22 (ref 10–24)
BUN: 25 mg/dL (ref 10–36)
Bilirubin Total: 0.2 mg/dL (ref 0.0–1.2)
CO2: 23 mmol/L (ref 20–29)
Calcium: 8.7 mg/dL (ref 8.6–10.2)
Chloride: 107 mmol/L — ABNORMAL HIGH (ref 96–106)
Creatinine, Ser: 1.13 mg/dL (ref 0.76–1.27)
Globulin, Total: 2.5 g/dL (ref 1.5–4.5)
Glucose: 92 mg/dL (ref 70–99)
Potassium: 4.5 mmol/L (ref 3.5–5.2)
Sodium: 142 mmol/L (ref 134–144)
Total Protein: 6.2 g/dL (ref 6.0–8.5)
eGFR: 62 mL/min/1.73 (ref >59)

## 2024-01-27 LAB — BRAIN NATRIURETIC PEPTIDE: BNP: 128.6 pg/mL — ABNORMAL HIGH (ref 0.0–100.0)

## 2024-01-27 LAB — MAGNESIUM: Magnesium: 2 mg/dL (ref 1.6–2.3)

## 2024-02-01 ENCOUNTER — Other Ambulatory Visit: Payer: Self-pay | Admitting: Family Medicine

## 2024-02-04 DIAGNOSIS — Z23 Encounter for immunization: Secondary | ICD-10-CM | POA: Diagnosis not present

## 2024-02-08 ENCOUNTER — Encounter (INDEPENDENT_AMBULATORY_CARE_PROVIDER_SITE_OTHER): Payer: Self-pay | Admitting: Vascular Surgery

## 2024-02-08 ENCOUNTER — Ambulatory Visit (INDEPENDENT_AMBULATORY_CARE_PROVIDER_SITE_OTHER): Payer: Self-pay | Admitting: Vascular Surgery

## 2024-02-08 VITALS — BP 160/54 | HR 58 | Resp 16 | Ht 71.0 in | Wt 151.4 lb

## 2024-02-08 DIAGNOSIS — J984 Other disorders of lung: Secondary | ICD-10-CM

## 2024-02-08 DIAGNOSIS — K219 Gastro-esophageal reflux disease without esophagitis: Secondary | ICD-10-CM | POA: Diagnosis not present

## 2024-02-08 DIAGNOSIS — E039 Hypothyroidism, unspecified: Secondary | ICD-10-CM | POA: Diagnosis not present

## 2024-02-08 DIAGNOSIS — I890 Lymphedema, not elsewhere classified: Secondary | ICD-10-CM

## 2024-02-08 NOTE — Progress Notes (Signed)
 Subjective:    Patient ID: Jesse Sandoval, male    DOB: 1931/04/16, 89 y.o.   MRN: [id number] Chief Complaint  Patient presents with   Establish Care    New patient consult venous insufficiency ref. Fisher    Jesse Sandoval is a 89 yo male who presents to today in consult for venous insufficiency and bilateral lower extremity swelling.  Patient endorses he has had swollen legs for couple of years now.  He states that his PCP sent him here because he was concerned about his right leg that has had more swelling than normal.  He had a right lower extremity venous Doppler ultrasound done on 01/26/2024.  These were negative for DVT or reflux.  Patient endorses he has not done any conventional therapies such as compression, rest, elevation or exercise.  He endorses at 89 years of age he is pretty mobile but he does not really exercise.  He does not complain of any pain to his lower extremities just feeling uncomfortable when they do get swollen.  He does try to elevate them but his wife's who is at the visit today states he is not consistent with doing this.    Review of Systems  Constitutional: Negative.   Cardiovascular:  Positive for leg swelling.  Musculoskeletal:  Positive for myalgias.  All other systems reviewed and are negative.      Objective:   Physical Exam Constitutional:      Appearance: Normal appearance. He is normal weight.  HENT:     Head: Normocephalic.  Eyes:     Pupils: Pupils are equal, round, and reactive to light.  Cardiovascular:     Rate and Rhythm: Normal rate and regular rhythm.     Pulses: Normal pulses.     Heart sounds: Normal heart sounds.  Pulmonary:     Effort: Pulmonary effort is normal.     Breath sounds: Normal breath sounds.  Abdominal:     General: Abdomen is flat. Bowel sounds are normal.     Palpations: Abdomen is soft.  Musculoskeletal:        General: Swelling present.     Right lower leg: Edema present.     Left lower leg: Edema present.  Skin:     General: Skin is warm and dry.     Capillary Refill: Capillary refill takes 2 to 3 seconds.  Neurological:     General: No focal deficit present.     Mental Status: He is alert and oriented to person, place, and time. Mental status is at baseline.  Psychiatric:        Mood and Affect: Mood normal.        Behavior: Behavior normal.        Thought Content: Thought content normal.        Judgment: Judgment normal.     BP (!) 160/54 (BP Location: Left Arm, Patient Position: Sitting, Cuff Size: Normal)   Pulse (!) 58   Resp 16   Ht 5' 11 (1.803 m)   Wt 151 lb 6.4 oz (68.7 kg)   BMI 21.12 kg/m   Past Medical History:  Diagnosis Date   Adult hypothyroidism    Arthritis    Asthma    Barrett's esophagus    Chronic bronchitis (HCC)    Colitis 06/26/2013   Diverticulosis    GERD (gastroesophageal reflux disease)    Glaucoma    History of colon polyps    Prostate enlargement    Prostatitis  Skin cancer    Temporomandibular joint disorders 08/15/2015   Ulcerative proctitis (HCC)     Social History   Socioeconomic History   Marital status: Married    Spouse name: Not on file   Number of children: 0   Years of education: Not on file   Highest education level: Not on file  Occupational History   Occupation: Retired  Tobacco Use   Smoking status: Never   Smokeless tobacco: Never  Vaping Use   Vaping status: Never Used  Substance and Sexual Activity   Alcohol  use: No   Drug use: No   Sexual activity: Not on file  Other Topics Concern   Not on file  Social History Narrative   Not on file   Social Drivers of Health   Financial Resource Strain: Not on file  Food Insecurity: Not on file  Transportation Needs: Not on file  Physical Activity: Not on file  Stress: Not on file  Social Connections: Not on file  Intimate Partner Violence: Not on file    Past Surgical History:  Procedure Laterality Date   CATARACT EXTRACTION W/PHACO Left 09/29/2022   Procedure:  CATARACT EXTRACTION PHACO AND INTRAOCULAR LENS PLACEMENT (IOC) LEFT  23.20  01:51.2;  Surgeon: Mittie Gaskin, MD;  Location: [facility];  Service: Ophthalmology;  Laterality: Left;   CATARACT EXTRACTION W/PHACO Right 10/13/2022   Procedure: CATARACT EXTRACTION PHACO AND INTRAOCULAR LENS PLACEMENT (IOC) RIGHT 20.62 02:04.1;  Surgeon: Mittie Gaskin, MD;  Location: [facility];  Service: Ophthalmology;  Laterality: Right;   COLONOSCOPY WITH PROPOFOL  N/A 01/03/2015   Procedure: COLONOSCOPY WITH PROPOFOL ;  Surgeon: Lamar ONEIDA Holmes, MD;  Location: [facility];  Service: Endoscopy;  Laterality: N/A;   CYSTECTOMY     chest area on the right   EXTERNAL EAR SURGERY     HAND SURGERY     HERNIA REPAIR     x3   HERNIA REPAIR  [phone]   PROSTATE SURGERY     SEPTOPLASTY      Family History  Problem Relation Age of Onset   Heart disease Mother    Heart failure Mother    Hypertension Mother    Cancer Father        pancreatic    Allergies  Allergen Reactions   Codeine    Other     Nolamine  (Guaifenesin and Dextromethorphan) causes difficulty urinating   Penicillins Diarrhea   Sulfa Antibiotics Hives       Latest Ref Rng & Units 07/08/2023   11:43 AM 08/31/2021    3:06 PM 07/20/2020    9:39 AM  CBC  WBC 3.4 - 10.8 x10E3/uL 6.0  5.9  7.6   Hemoglobin 13.0 - 17.7 g/dL 88.3  88.5  88.1   Hematocrit 37.5 - 51.0 % 36.4  34.5  35.0   Platelets 150 - 450 x10E3/uL 227  213  190       CMP     Component Value Date/Time   NA 142 01/26/2024 1328   K 4.5 01/26/2024 1328   CL 107 (H) 01/26/2024 1328   CO2 23 01/26/2024 1328   GLUCOSE 92 01/26/2024 1328   GLUCOSE 147 (H) 07/20/2020 0939   BUN 25 01/26/2024 1328   CREATININE 1.13 01/26/2024 1328   CALCIUM 8.7 01/26/2024 1328   PROT 6.2 01/26/2024 1328   ALBUMIN 3.7 01/26/2024 1328   AST 19 01/26/2024 1328   ALT 15 01/26/2024 1328   ALKPHOS 113 01/26/2024 1328   BILITOT  0.2 01/26/2024 1328   EGFR  62 01/26/2024 1328   GFRNONAA 58 (L) 07/20/2020 0939     No results found.     Assessment & Plan:   1. Lymphedema (Primary) Recommend:  I have had a long discussion with the patient regarding swelling and why it  causes symptoms.  Patient will begin wearing graduated compression on a daily basis a prescription was given. The patient will  wear the stockings first thing in the morning and removing them in the evening. The patient is instructed specifically not to sleep in the stockings.   In addition, behavioral modification will be initiated.  This will include frequent elevation, use of over the counter pain medications and exercise such as walking.  Consideration for a lymph pump will also be made based upon the effectiveness of conservative therapy.  This would help to improve the edema control and prevent sequela such as ulcers and infections   Patient underwent right duplex ultrasound of the venous system to ensure that DVT or reflux is not present. Ultrasounds were negative for both.   The patient will follow up with PCP for any concerns or worsening symptoms.   2. Gastroesophageal reflux disease without esophagitis Continue PPI as already ordered, this medication has been reviewed and there are no changes at this time.  Avoidence of caffeine and alcohol   Moderate elevation of the head of the bed   3. Restrictive lung disease Continue pulmonary medications and aerosols as already ordered, these medications have been reviewed and there are no changes at this time.   4. Adult hypothyroidism Continue Synthroid  as already ordered, this medication has been reviewed and there are no changes at this time.    Current Outpatient Medications on File Prior to Visit  Medication Sig Dispense Refill   acetaminophen (TYLENOL) 500 MG tablet Take 500 mg by mouth every 6 (six) hours as needed.     albuterol  (VENTOLIN  HFA) 108 (90 Base) MCG/ACT inhaler TAKE 2 PUFFS BY MOUTH EVERY 6 HOURS  AS NEEDED FOR WHEEZE OR SHORTNESS OF BREATH 18 each 1   famotidine (PEPCID) 20 MG tablet Take 20 mg by mouth daily.     Fluticasone  Furoate (ARNUITY ELLIPTA ) 100 MCG/ACT AEPB Inhale 1 puff into the lungs daily. 60 each 6   latanoprost (XALATAN) 0.005 % ophthalmic solution Place 1 drop into both eyes daily.     levothyroxine  (SYNTHROID ) 50 MCG tablet TAKE 1 TABLET BY MOUTH EVERY DAY 90 tablet 4   montelukast  (SINGULAIR ) 10 MG tablet TAKE 1 TABLET BY MOUTH EVERY DAY 90 tablet 3   naproxen  (NAPROSYN ) 500 MG tablet Take 1 tablet (500 mg total) by mouth 2 (two) times daily as needed (back pain). 60 tablet 5   pantoprazole  (PROTONIX ) 40 MG tablet Take 1 tablet (40 mg total) by mouth daily. 30 tablet 5   No current facility-administered medications on file prior to visit.    There are no Patient Instructions on file for this visit. No follow-ups on file.   Gwendlyn JONELLE Shank, NP

## 2024-02-10 DIAGNOSIS — H401132 Primary open-angle glaucoma, bilateral, moderate stage: Secondary | ICD-10-CM | POA: Diagnosis not present

## 2024-02-22 DIAGNOSIS — Z23 Encounter for immunization: Secondary | ICD-10-CM | POA: Diagnosis not present

## 2024-03-01 ENCOUNTER — Other Ambulatory Visit: Payer: Self-pay | Admitting: Family Medicine

## 2024-03-01 DIAGNOSIS — G8929 Other chronic pain: Secondary | ICD-10-CM

## 2024-03-02 DIAGNOSIS — H401133 Primary open-angle glaucoma, bilateral, severe stage: Secondary | ICD-10-CM | POA: Diagnosis not present

## 2024-03-16 ENCOUNTER — Ambulatory Visit

## 2024-03-16 ENCOUNTER — Other Ambulatory Visit: Payer: Self-pay

## 2024-03-16 DIAGNOSIS — R601 Generalized edema: Secondary | ICD-10-CM | POA: Diagnosis present

## 2024-03-16 LAB — ECHOCARDIOGRAM COMPLETE
AR max vel: 3.17 cm2
AV Area VTI: 2.25 cm2
AV Area mean vel: 2.48 cm2
AV Mean grad: 6 mmHg
AV Peak grad: 10.6 mmHg
Ao pk vel: 1.63 m/s
Area-P 1/2: 2.68 cm2
S' Lateral: 2.4 cm

## 2024-03-18 ENCOUNTER — Ambulatory Visit: Payer: Self-pay

## 2024-03-28 IMAGING — CR DG ABDOMEN 2V
1 series · 2 of 2 positions shown · non-contrast
Comparison: None Available.

CLINICAL DATA: Abdominal pain.

EXAM:
ABDOMEN - 2 VIEW

[Series 1: dg abd 2 views · 0.14mm/px · 2 of 2 slices shown]
[im 1/2]
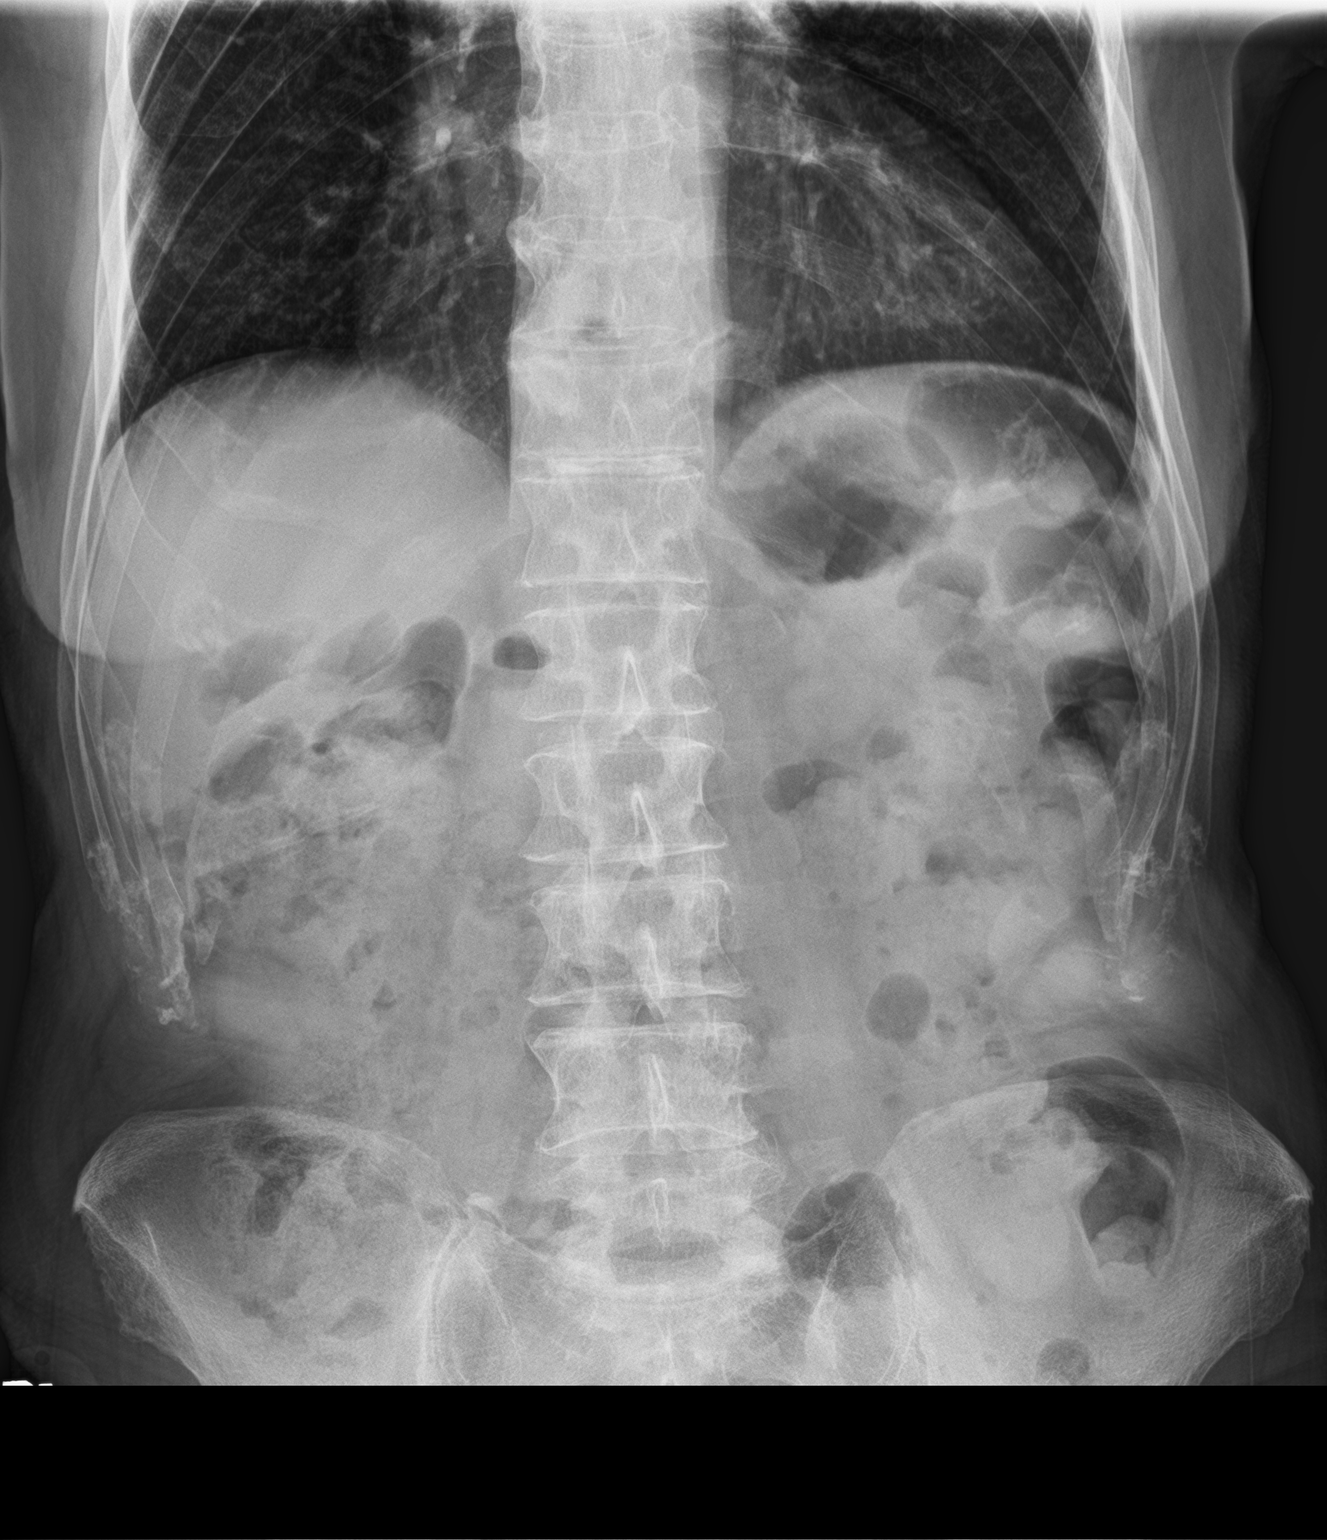
[im 2/2]
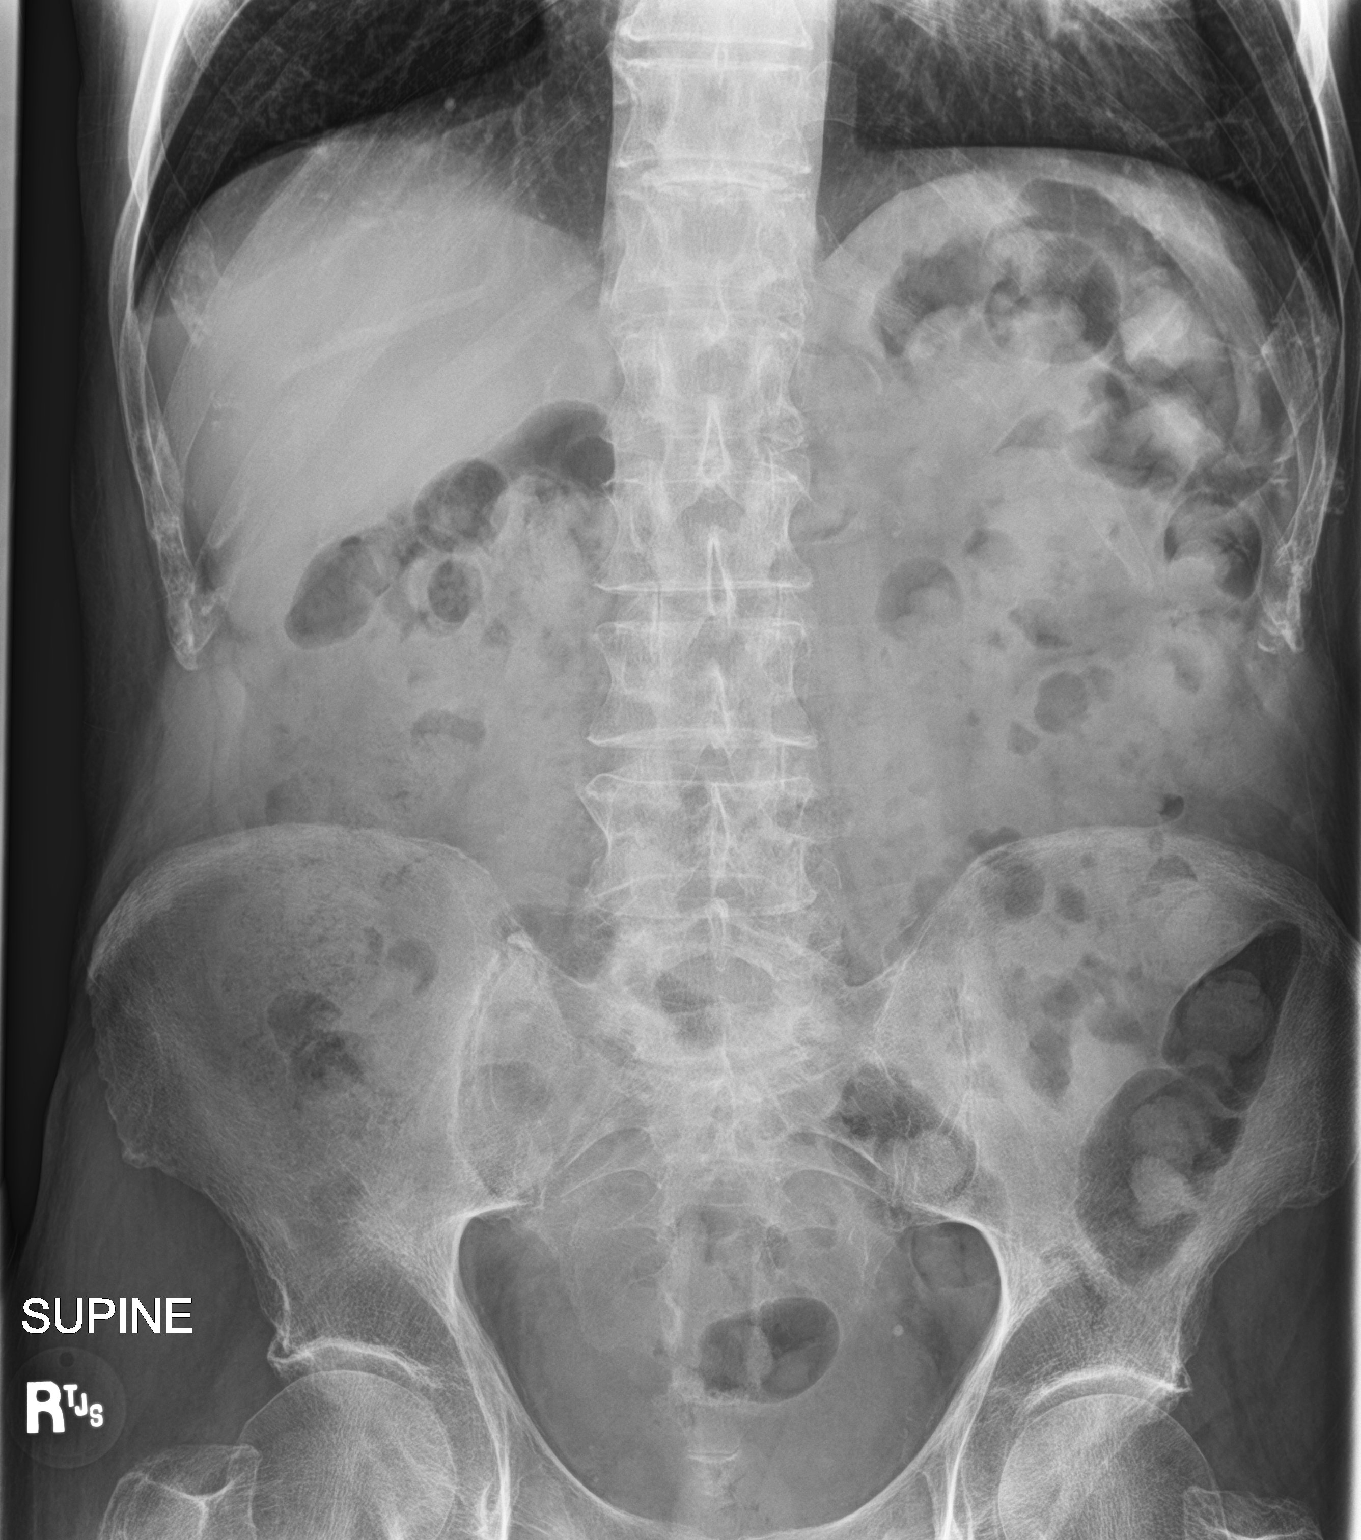

[2 of 2 positions shown; findings below may reference images not displayed]

FINDINGS: Moderate amount of stool throughout the colon. No bowel dilatation
or evidence of obstruction. No free air or radiopaque calculi. The
osseous structures are intact. The soft tissues are grossly
unremarkable.
IMPRESSION: Constipation. No bowel obstruction.
